# Patient Record
Sex: Male | Born: 1950 | Race: Black or African American | Hispanic: No | Marital: Single | Smoking: Never smoker
Health system: Southern US, Community
[De-identification: ages and names within clinical notes are randomized; demographics above are authoritative.]

## PROBLEM LIST (undated history)

## (undated) DIAGNOSIS — N429 Disorder of prostate, unspecified: Secondary | ICD-10-CM

## (undated) HISTORY — PX: COLONOSCOPY: SHX174

---

## 1978-09-24 DIAGNOSIS — T07XXXA Unspecified multiple injuries, initial encounter: Secondary | ICD-10-CM

## 1978-09-24 HISTORY — DX: Unspecified multiple injuries, initial encounter: T07.XXXA

## 2012-04-09 ENCOUNTER — Ambulatory Visit: Payer: No Typology Code available for payment source | Attending: Internal Medicine

## 2012-04-09 ENCOUNTER — Other Ambulatory Visit: Payer: Self-pay | Admitting: Internal Medicine

## 2012-04-09 DIAGNOSIS — R52 Pain, unspecified: Secondary | ICD-10-CM

## 2012-07-04 HISTORY — PX: CYSTOSCOPY: SUR368

## 2012-08-05 ENCOUNTER — Ambulatory Visit: Payer: No Typology Code available for payment source

## 2012-08-06 ENCOUNTER — Ambulatory Visit: Payer: No Typology Code available for payment source | Admitting: Certified Registered"

## 2012-08-06 ENCOUNTER — Ambulatory Visit: Payer: No Typology Code available for payment source | Admitting: Urology

## 2012-08-06 ENCOUNTER — Encounter: Payer: Self-pay | Admitting: Certified Registered"

## 2012-08-06 ENCOUNTER — Encounter
Admission: RE | Disposition: A | Discharge status: Home / Self Care | Payer: Self-pay | Source: Ambulatory Visit | Attending: Urology

## 2012-08-06 ENCOUNTER — Ambulatory Visit
Admission: RE | Admit: 2012-08-06 | Discharge: 2012-08-06 | Disposition: A | Discharge status: Home / Self Care | Payer: No Typology Code available for payment source | Source: Ambulatory Visit | Attending: Urology | Admitting: Urology

## 2012-08-06 DIAGNOSIS — Z87891 Personal history of nicotine dependence: Secondary | ICD-10-CM | POA: Insufficient documentation

## 2012-08-06 DIAGNOSIS — N2889 Other specified disorders of kidney and ureter: Secondary | ICD-10-CM | POA: Insufficient documentation

## 2012-08-06 DIAGNOSIS — D494 Neoplasm of unspecified behavior of bladder: Secondary | ICD-10-CM | POA: Diagnosis present

## 2012-08-06 HISTORY — PX: CYSTOSCOPY: SHX3552

## 2012-08-06 SURGERY — CYSTOSCOPY, DIAGNOSTIC
Anesthesia: Anesthesia General | Site: Pelvis | Wound class: Clean Contaminated

## 2012-08-06 MED ORDER — LEVOFLOXACIN 500 MG PO TABS
500.0000 mg | ORAL_TABLET | Freq: Every day | ORAL | Status: DC
Start: 2012-08-06 — End: 2014-07-29

## 2012-08-06 MED ORDER — MIDAZOLAM HCL 2 MG/2ML IJ SOLN
INTRAMUSCULAR | Status: DC | PRN
Start: 2012-08-06 — End: 2012-08-06
  Administered 2012-08-06: 2 mg via INTRAVENOUS

## 2012-08-06 MED ORDER — LEVOFLOXACIN IN D5W 500 MG/100ML IV SOLN
INTRAVENOUS | Status: AC
Start: 2012-08-06 — End: ?
  Filled 2012-08-06: qty 100

## 2012-08-06 MED ORDER — FENTANYL CITRATE 0.05 MG/ML IJ SOLN
INTRAMUSCULAR | Status: DC | PRN
Start: 2012-08-06 — End: 2012-08-06
  Administered 2012-08-06: 25 ug via INTRAVENOUS

## 2012-08-06 MED ORDER — LIDOCAINE(URO-JET) 2% JELLY (WRAP)
CUTANEOUS | Status: AC
Start: 2012-08-06 — End: ?
  Filled 2012-08-06: qty 1

## 2012-08-06 MED ORDER — PROPOFOL INFUSION 10 MG/ML
INTRAVENOUS | Status: DC | PRN
Start: 2012-08-06 — End: 2012-08-06
  Administered 2012-08-06: 200 mg via INTRAVENOUS

## 2012-08-06 MED ORDER — FENTANYL CITRATE 0.05 MG/ML IJ SOLN
INTRAMUSCULAR | Status: AC
Start: 2012-08-06 — End: ?
  Filled 2012-08-06: qty 2

## 2012-08-06 MED ORDER — MIDAZOLAM HCL 2 MG/2ML IJ SOLN
INTRAMUSCULAR | Status: AC
Start: 2012-08-06 — End: ?
  Filled 2012-08-06: qty 2

## 2012-08-06 MED ORDER — LIDOCAINE HCL 2 % IJ SOLN
INTRAMUSCULAR | Status: DC | PRN
Start: 2012-08-06 — End: 2012-08-06
  Administered 2012-08-06: 100 mg via INTRAVENOUS

## 2012-08-06 MED ORDER — LACTATED RINGERS IV SOLN
INTRAVENOUS | Status: DC | PRN
Start: 2012-08-06 — End: 2012-08-06

## 2012-08-06 MED ORDER — PHENAZOPYRIDINE HCL 100 MG PO TABS
100.0000 mg | ORAL_TABLET | Freq: Three times a day (TID) | ORAL | Status: DC | PRN
Start: 2012-08-06 — End: 2014-07-29

## 2012-08-06 MED ORDER — STERILE WATER FOR IRRIGATION IR SOLN
Status: DC | PRN
Start: 2012-08-06 — End: 2012-08-06
  Administered 2012-08-06: 3000 mL

## 2012-08-06 MED ORDER — LEVOFLOXACIN IN D5W 500 MG/100ML IV SOLN
500.0000 mg | INTRAVENOUS | Status: DC
Start: 2012-08-06 — End: 2012-08-06
  Administered 2012-08-06: 500 mg via INTRAVENOUS

## 2012-08-06 MED ORDER — ONDANSETRON HCL 4 MG/2ML IJ SOLN
INTRAMUSCULAR | Status: DC | PRN
Start: 2012-08-06 — End: 2012-08-06
  Administered 2012-08-06: 4 mg via INTRAVENOUS

## 2012-08-06 SURGICAL SUPPLY — 19 items
CATH TRANSVENE SQ 50CM (IV Supply) ×2 IMPLANT
CATHETER URETHRAL OD20 FR 30 CC FOLEY 3 (Catheter Miscellaneous) ×1 IMPLANT
CATHETER URETHRAL OD20 FR 30 CC FOLEY 3 WAY 2 STAGGER DRAINAGE EYE (Catheter Miscellaneous) ×1 IMPLANT
CATHETER URTH RBR DDRGL 30CC STD BDX (Catheter Micellaneous) ×2
CONTAINER SPECIMEN 4 OZ STRL (Procedure Accessories) ×2 IMPLANT
DRAIN INCS .25IN FULL FLUT RND SIL 19FR (Drain) ×1
DRAIN INCS SIL FULL FLUT RND 19FR .25IN (Drain) ×1
DRAIN OD19 FR RADIOPAQUE 4 FREE FLOW (Drain) ×1 IMPLANT
DRAIN OD19 FR RADIOPAQUE 4 FREE FLOW CHANNEL TROCAR CHANNEL DRAIN L1/4 (Drain) ×1 IMPLANT
GLOVE SYNT DURAPRENE SZ 7.5 (Glove) ×2 IMPLANT
INACTIVE USE LAWSON 93583 (Gown) ×2 IMPLANT
PACK SURG CYSTOSCOPY I (Pack) ×2 IMPLANT
PAD ELECTROSRG GRND REM W CRD (Procedure Accessories) ×2 IMPLANT
SET IRR DEHP 10 GTT/ML STRG 81IN LF STRL (Tubing) ×2
SET IRRIGATION L81 IN 10 GTT/ML STRAIGHT (Tubing) ×1 IMPLANT
SET IRRIGATION L81 IN 10 GTT/ML STRAIGHT NA DEHP BLADDER REGULATE (Tubing) ×1 IMPLANT
SYRINGE SLIP-TIP 10CC (Syringes, Needles) ×2 IMPLANT
SYRINGE TOOMEY (Syringes, Needles) ×2 IMPLANT
TUBE ARTHRO CASSETTE SET (Ortho Supply) ×2 IMPLANT

## 2012-08-06 NOTE — Anesthesia Postprocedure Evaluation (Signed)
Anesthesia Post Evaluation    Patient: Victor Avila    Procedures performed: Procedure(s) with comments:  CYSTOSCOPY, TUR BLADDER TUMOR - CYSTO, POSSIBLE TURBT  CYSTOSCOPY    Anesthesia type: General LMA    Patient location:Phase II PACU    Last vitals:   Filed Vitals:    08/06/12 0816   BP: 109/58   Pulse: 73   Temp:    Resp: 16   SpO2: 99%       Post pain: Patient not complaining of pain, continue current therapy      Mental Status:awake    Respiratory Function: tolerating room air    Cardiovascular: stable    Nausea/Vomiting: patient not complaining of nausea or vomiting    Hydration Status: adequate    Post assessment: no apparent anesthetic complications

## 2012-08-06 NOTE — Brief Op Note (Signed)
Preop bladder mass and atypical cytology and negative FISH  Postop Left ureterocele  Procedure Cysto  Surgeon P Zohaib Heeney  Complication Nil  Specimen Nil  EBL/Transufusion  Nil  Drains/implants none  Victor Avila

## 2012-08-06 NOTE — Op Note (Signed)
Procedure Date: 08/06/2012     Patient Type: A     SURGEON: Alisabeth Selkirk Humphrey Rolls MD  ASSISTANT:       PREOPERATIVE DIAGNOSES:  Urinary symptoms, atypical cytology, left trigonal bladder mass.     POSTOPERATIVE DIAGNOSIS:  Ureterocele.     TITLE OF PROCEDURE:  Cystoscopy.     BRIEF PREOPERATIVE HISTORY:    This is a 62 year old that was seen for urinary frequency and nocturia.  He  had not responded to alpha blockers and underwent cystoscopy.  At the time  of cystoscopy he was found to have a nonobstructive prostate, however,  there is a left trigonal mass at the same time as urine cytology was done  which came back as atypical.  He did have a normal FISH.  He is brought to  the OR today for cystoscopy/resection of bladder mass/possible biopsy.     DESCRIPTION OF PROCEDURE:  The patient received perioperative antibiotics, was placed under general  anesthesia, put in lithotomy position, prepped and draped in standard  sterile fashion.  The cystoscope was inserted into the bladder.  Once  again, the prostatic urethra was noted to be nonobstructive.  The bladder  neck is inspected and found to be open.  The right ureteral orifice is  normal in its location and position.  The rest of the bladder is normal.   On the left trigone mound is seen with a pinhole ureteral orifice, which  decompresses and the mound disappears, consistent with a nonobstructive  left ureterocele.  Since this is a benign condition, a biopsy is not done.   The bladder is drained and the patient was taken in stable condition to the  recovery room.           D:  08/06/2012 07:59 AM by Dr. Hewitt Shorts. Celine Mans, MD ([id number])  T:  08/06/2012 10:14 AM by [id number]      Everlean Cherry: [id number]) (Doc ID: [id number])

## 2012-08-06 NOTE — Transfer of Care (Signed)
Anesthesia Transfer of Care Note    Patient: Victor Avila    Procedures performed: Procedure(s) with comments:  CYSTOSCOPY, TUR BLADDER TUMOR - CYSTO, POSSIBLE TURBT  CYSTOSCOPY    Anesthesia type: General LMA    Patient location:Phase II PACU    Last vitals:   Filed Vitals:    08/06/12 0804   BP: 93/59   Pulse: 73   Temp: 98 F (36.7 C)   Resp: 16   SpO2: 98%       Post pain: Patient not complaining of pain, continue current therapy      Mental Status:awake and alert     Respiratory Function: tolerating room air    Cardiovascular: stable    Nausea/Vomiting: patient not complaining of nausea or vomiting    Hydration Status: adequate    Post assessment: no apparent anesthetic complications, no reportable events and no evidence of recall

## 2012-08-06 NOTE — Anesthesia Preprocedure Evaluation (Signed)
Anesthesia Evaluation    AIRWAY    Mallampati: I    TM distance: >3 FB  Neck ROM: full  Mouth Opening:full   CARDIOVASCULAR    cardiovascular exam normal       DENTAL    No notable dental hx    (+) upper dentures   PULMONARY    pulmonary exam normal     OTHER FINDINGS                      Anesthesia Plan    ASA 1     general                                 informed consent obtained    Plan discussed with CRNA.

## 2012-08-06 NOTE — Discharge Instructions (Signed)
Cystoscopy  Cystoscopy is a procedure that lets your doctor look directly inside your urethra and bladder. It can be used to:   Help diagnose a problem with your urethra, bladder, or kidneys.   Take a sample (biopsy) of bladder or urethral tissue.   Treat certain problems (such as removing kidney stones).   Place a stent to bypass an obstruction.   Take special x-rays of the kidneys.  Based on the findings, your doctor may recommend other tests or treatments.  What Is a Cystoscope?  A cystoscope is a telescope-like instrument that contains lenses and fiberoptics (small glass wires that make bright light). The cystoscope may be straight and rigid, or flexible to bend around curves in the urethra. The doctor may look directly into the cystoscope, or project the image onto a monitor.  Getting Ready  To prepare, stop taking any medications as instructed. Ask whether you should avoid eating or drinking anything after midnight before the procedure. Follow any other instructions your doctor gives you.  Tell yourdoctor before the exam if you:   Take any medications, such as aspirin or blood thinners   Have allergies to any medications   Are pregnant   The Procedure  Cystoscopy is done in the doctor's office or hospital. The doctor and sometimes a nurse are present during the procedure. It takes only a few minutes, longer if a biopsy, x-ray, or treatment needs to be done. During the procedure:   You lie on an exam table on your back, knees bent and legs apart. You are covered with a drape.   Your urethra and the area around it are washed. Anesthetic jelly may be applied to numb the urethra. Other pain medication is usually not needed. In some cases, you may be offered a mild sedative to help you relax. If a more extensive procedure is to be done, such as a biopsy or kidney stone removal, general anesthesia may be needed.   The cystoscope is inserted. A sterile fluid is put into the bladder to expand it. You may  feel pressure from this fluid.   When the procedure is done, the cystoscope is removed.  After the Procedure  If you had a sedative, general anesthesia, or spinal anesthesia, you must have someone drive you home. Once you're home:   Drink plenty of fluids.   You may have burning or light bleeding when you urinate--this is normal.   Medications may be prescribed to ease any discomfort or prevent infection. Take these as directed.   Call your doctor if you have heavy bleeding or blood clots, burning that lasts more than a day, a fever over101F , or trouble urinating.   2000-2014 [employer], [address], [place] [address]. All rights reserved. This information is not intended as a substitute for professional medical care. Always follow your healthcare professional's instructions.        Post Anesthesia Discharge Instructions    Although you may be awake and alert in the recovery room, small amounts of anesthetic remain in your system for about 24 hours.  You may feel tired and sleepy during this time.      You are advised to go directly home from the hospital.    Plan to stay at home and rest for the remainder of the day.    It is advisable to have someone with you at home for 24 hours after surgery.    Do not operate a motor vehicle, or any mechanical   or electrical equipment for the next 24 hours.      Be careful when you are walking around, you may become dizzy.  The effects of anesthesia and/or medications are still present and drowsiness may occur    Do not consume alcohol, tranquilizers, sleeping medications, or any other non prescribed medication for the remainder of the day.    Diet:  begin with liquids, progress your diet as tolerated or as directed by your surgeon.  Nausea and vomiting may occur in the next 24 hours.

## 2012-08-06 NOTE — H&P (Signed)
Pt seen and examined.  No significant changes to pulm, cardiac or abdominal exam.  Plan for cysto TURBT  Cariana Karge Humphrey Rolls

## 2012-08-07 ENCOUNTER — Encounter: Payer: Self-pay | Admitting: Urology

## 2014-07-15 ENCOUNTER — Ambulatory Visit
Admission: RE | Admit: 2014-07-15 | Discharge: 2014-07-15 | Disposition: A | Discharge status: Home / Self Care | Payer: No Typology Code available for payment source | Source: Ambulatory Visit | Attending: Internal Medicine | Admitting: Internal Medicine

## 2014-07-15 ENCOUNTER — Other Ambulatory Visit: Payer: Self-pay | Admitting: Internal Medicine

## 2014-07-15 DIAGNOSIS — Z01818 Encounter for other preprocedural examination: Secondary | ICD-10-CM | POA: Insufficient documentation

## 2014-07-29 ENCOUNTER — Telehealth: Payer: No Typology Code available for payment source

## 2014-08-04 ENCOUNTER — Ambulatory Visit: Payer: Self-pay

## 2014-08-04 ENCOUNTER — Ambulatory Visit: Payer: No Typology Code available for payment source | Admitting: Certified Registered"

## 2014-08-04 ENCOUNTER — Observation Stay: Payer: No Typology Code available for payment source | Admitting: Urology

## 2014-08-04 ENCOUNTER — Encounter
Admission: RE | Disposition: A | Discharge status: Home / Self Care | Payer: Self-pay | Source: Ambulatory Visit | Attending: Urology

## 2014-08-04 ENCOUNTER — Observation Stay
Admission: RE | Admit: 2014-08-04 | Discharge: 2014-08-05 | Disposition: A | Discharge status: Home / Self Care | Payer: No Typology Code available for payment source | Source: Ambulatory Visit | Attending: Urology | Admitting: Urology

## 2014-08-04 DIAGNOSIS — Z87891 Personal history of nicotine dependence: Secondary | ICD-10-CM | POA: Insufficient documentation

## 2014-08-04 DIAGNOSIS — N401 Enlarged prostate with lower urinary tract symptoms: Principal | ICD-10-CM

## 2014-08-04 DIAGNOSIS — N400 Enlarged prostate without lower urinary tract symptoms: Secondary | ICD-10-CM | POA: Diagnosis present

## 2014-08-04 DIAGNOSIS — R350 Frequency of micturition: Secondary | ICD-10-CM | POA: Insufficient documentation

## 2014-08-04 DIAGNOSIS — N403 Nodular prostate with lower urinary tract symptoms: Secondary | ICD-10-CM

## 2014-08-04 HISTORY — DX: Disorder of prostate, unspecified: N42.9

## 2014-08-04 HISTORY — PX: CYSTOSCOPY, TURP: SHX3621

## 2014-08-04 SURGERY — CYSTOSCOPY, WITH TRANSURETHRAL RESECTION PROSTATE (TURP)
Anesthesia: Anesthesia General | Site: Pelvis | Wound class: Clean Contaminated

## 2014-08-04 MED ORDER — LACTATED RINGERS IV SOLN
INTRAVENOUS | Status: DC | PRN
Start: 2014-08-04 — End: 2014-08-04

## 2014-08-04 MED ORDER — FENTANYL CITRATE (PF) 50 MCG/ML IJ SOLN (WRAP)
25.0000 ug | INTRAMUSCULAR | Status: DC | PRN
Start: 2014-08-04 — End: 2014-08-04

## 2014-08-04 MED ORDER — HYDROMORPHONE HCL 1 MG/ML IJ SOLN
0.5000 mg | INTRAMUSCULAR | Status: DC | PRN
Start: 2014-08-04 — End: 2014-08-04

## 2014-08-04 MED ORDER — ONDANSETRON HCL 4 MG/2ML IJ SOLN
INTRAMUSCULAR | Status: AC
Start: 2014-08-04 — End: ?
  Filled 2014-08-04: qty 2

## 2014-08-04 MED ORDER — LIDOCAINE HCL (PF) 2 % IJ SOLN
INTRAMUSCULAR | Status: AC
Start: 2014-08-04 — End: ?
  Filled 2014-08-04: qty 5

## 2014-08-04 MED ORDER — KETOROLAC TROMETHAMINE 15 MG/ML IJ SOLN
15.0000 mg | Freq: Four times a day (QID) | INTRAMUSCULAR | Status: DC
Start: 2014-08-04 — End: 2014-08-05
  Administered 2014-08-05: 15 mg via INTRAVENOUS
  Filled 2014-08-04: qty 1

## 2014-08-04 MED ORDER — ONDANSETRON HCL 4 MG/2ML IJ SOLN
4.0000 mg | Freq: Once | INTRAMUSCULAR | Status: DC | PRN
Start: 2014-08-04 — End: 2014-08-04

## 2014-08-04 MED ORDER — EPHEDRINE SULFATE 50 MG/ML IJ SOLN
INTRAMUSCULAR | Status: AC
Start: 2014-08-04 — End: ?
  Filled 2014-08-04: qty 1

## 2014-08-04 MED ORDER — OXYCODONE-ACETAMINOPHEN 5-325 MG PO TABS
2.0000 | ORAL_TABLET | ORAL | Status: DC | PRN
Start: 2014-08-04 — End: 2014-08-05

## 2014-08-04 MED ORDER — OXYCODONE-ACETAMINOPHEN 5-325 MG PO TABS
1.0000 | ORAL_TABLET | Freq: Once | ORAL | Status: DC | PRN
Start: 2014-08-04 — End: 2014-08-04

## 2014-08-04 MED ORDER — ACETAMINOPHEN 325 MG PO TABS
650.0000 mg | ORAL_TABLET | ORAL | Status: DC | PRN
Start: 2014-08-04 — End: 2014-08-05

## 2014-08-04 MED ORDER — LEVOFLOXACIN IN D5W 500 MG/100ML IV SOLN
INTRAVENOUS | Status: AC
Start: 2014-08-04 — End: ?
  Filled 2014-08-04: qty 100

## 2014-08-04 MED ORDER — LACTATED RINGERS IV SOLN
INTRAVENOUS | Status: DC
Start: 2014-08-04 — End: 2014-08-05

## 2014-08-04 MED ORDER — PROMETHAZINE HCL 25 MG/ML IJ SOLN
6.2500 mg | Freq: Once | INTRAMUSCULAR | Status: DC | PRN
Start: 2014-08-04 — End: 2014-08-04

## 2014-08-04 MED ORDER — EPHEDRINE SULFATE 50 MG/ML IJ SOLN
INTRAMUSCULAR | Status: DC | PRN
Start: 2014-08-04 — End: 2014-08-04
  Administered 2014-08-04: 10 mg via INTRAVENOUS

## 2014-08-04 MED ORDER — SODIUM CHLORIDE 0.9 % IJ SOLN
INTRAMUSCULAR | Status: AC
Start: 2014-08-04 — End: ?
  Filled 2014-08-04: qty 10

## 2014-08-04 MED ORDER — LIDOCAINE HCL 2 % IJ SOLN
INTRAMUSCULAR | Status: DC | PRN
Start: 2014-08-04 — End: 2014-08-04
  Administered 2014-08-04: 100 mg via INTRAVENOUS

## 2014-08-04 MED ORDER — ONDANSETRON HCL 4 MG/2ML IJ SOLN
INTRAMUSCULAR | Status: DC | PRN
Start: 2014-08-04 — End: 2014-08-04
  Administered 2014-08-04: 4 mg via INTRAVENOUS

## 2014-08-04 MED ORDER — DEXAMETHASONE SOD PHOSPHATE PF 10 MG/ML IJ SOLN
INTRAMUSCULAR | Status: AC
Start: 2014-08-04 — End: ?
  Filled 2014-08-04: qty 1

## 2014-08-04 MED ORDER — FENTANYL CITRATE (PF) 50 MCG/ML IJ SOLN (WRAP)
INTRAMUSCULAR | Status: DC | PRN
Start: 2014-08-04 — End: 2014-08-04
  Administered 2014-08-04 (×2): 50 ug via INTRAVENOUS

## 2014-08-04 MED ORDER — SUCCINYLCHOLINE CHLORIDE 20 MG/ML IJ SOLN
INTRAMUSCULAR | Status: AC
Start: 2014-08-04 — End: ?
  Filled 2014-08-04: qty 10

## 2014-08-04 MED ORDER — MIDAZOLAM HCL 2 MG/2ML IJ SOLN
INTRAMUSCULAR | Status: AC
Start: 2014-08-04 — End: ?
  Filled 2014-08-04: qty 2

## 2014-08-04 MED ORDER — HYDROMORPHONE HCL 1 MG/ML IJ SOLN
INTRAMUSCULAR | Status: DC | PRN
Start: 2014-08-04 — End: 2014-08-04
  Administered 2014-08-04: .25 mg via INTRAVENOUS
  Administered 2014-08-04: 0.5 mg via INTRAVENOUS
  Administered 2014-08-04: .25 mg via INTRAVENOUS

## 2014-08-04 MED ORDER — LEVOFLOXACIN IN D5W 500 MG/100ML IV SOLN
500.0000 mg | INTRAVENOUS | Status: AC
Start: 2014-08-04 — End: 2014-08-04
  Administered 2014-08-04: 500 mg via INTRAVENOUS

## 2014-08-04 MED ORDER — PROPOFOL INFUSION 10 MG/ML
INTRAVENOUS | Status: DC | PRN
Start: 2014-08-04 — End: 2014-08-04
  Administered 2014-08-04: 160 mg via INTRAVENOUS

## 2014-08-04 MED ORDER — FENTANYL CITRATE (PF) 50 MCG/ML IJ SOLN (WRAP)
INTRAMUSCULAR | Status: AC
Start: 2014-08-04 — End: ?
  Filled 2014-08-04: qty 2

## 2014-08-04 MED ORDER — DEXAMETHASONE SODIUM PHOSPHATE 4 MG/ML IJ SOLN (WRAP)
INTRAMUSCULAR | Status: DC | PRN
Start: 2014-08-04 — End: 2014-08-04
  Administered 2014-08-04: 8 mg via INTRAVENOUS

## 2014-08-04 MED ORDER — HYDROMORPHONE HCL 1 MG/ML IJ SOLN
INTRAMUSCULAR | Status: AC
Start: 2014-08-04 — End: ?
  Filled 2014-08-04: qty 1

## 2014-08-04 MED ORDER — ONDANSETRON HCL 4 MG/2ML IJ SOLN
4.0000 mg | Freq: Four times a day (QID) | INTRAMUSCULAR | Status: DC
Start: 2014-08-04 — End: 2014-08-05

## 2014-08-04 MED ORDER — MIDAZOLAM HCL 2 MG/2ML IJ SOLN
INTRAMUSCULAR | Status: DC | PRN
Start: 2014-08-04 — End: 2014-08-04
  Administered 2014-08-04: 2 mg via INTRAVENOUS

## 2014-08-04 MED ORDER — PROPOFOL 10 MG/ML IV EMUL (WRAP)
INTRAVENOUS | Status: AC
Start: 2014-08-04 — End: ?
  Filled 2014-08-04: qty 40

## 2014-08-04 SURGICAL SUPPLY — 12 items
BAG DRAINAGE URINARY (Procedure Accessories) ×2 IMPLANT
CATHETER URETHRAL OD22 FR 30 CC FOLEY 3 (Catheter Urine) ×1 IMPLANT
CATHETER URETHRAL OD22 FR FOLEY 3 WAY 2 STAGGER DRAINAGE EYE MEDIUM (Catheter Urine) ×1 IMPLANT
CATHETER URTH RBR DDRGL 30CC STD BDX (Catheter Urine) ×2
ELECTRODE CUTTING LOOP HIGH FREQUENCY ARTHROSCOPIC TURIS 24FR WA22523C (Endoscopic Supplies) ×1 IMPLANT
ELECTRODE RESECTION HF 30DEG (Endoscopic Supplies) ×2 IMPLANT
GLOVE SRG PLISPRN 8 BGL PI ULTRATOUCH G (Glove) ×2
GLOVE SURGICAL 8 BIOGEL PI ULTRATOUCH G (Glove) ×1 IMPLANT
GLOVE SURGICAL 8 BIOGEL PI ULTRATOUCH G POWDER FREE ROUGH BEAD CUFF (Glove) ×1 IMPLANT
HOLDER CATH (Procedure Accessories) ×2 IMPLANT
PACK SURG CYSTOSCOPY I (Pack) ×2 IMPLANT
SYRINGE TOOMEY (Syringes, Needles) ×2 IMPLANT

## 2014-08-04 NOTE — Anesthesia Preprocedure Evaluation (Addendum)
Anesthesia Evaluation    AIRWAY    Mallampati: III    TM distance: >3 FB  Neck ROM: full  Mouth Opening:full   CARDIOVASCULAR    cardiovascular exam normal       DENTAL    no notable dental hx    (+) upper dentures   PULMONARY    pulmonary exam normal     OTHER FINDINGS                      Anesthesia Plan    ASA 2     general               (The most common side effects from anesthesia are nausea, vomiting, sore throat.  Uncommon side effects include but not limited to  injury to eyes, lips, teeth, gums, vocal cords, allergic rections, nerve injuries. In addition there are side effects associated with your medical conditions.    Answered all questions to patient's satisfaction.  Pt understands and wishes to proceed.    Eric Robert Heinz, MD  07/25/2011 )      intravenous induction           Post op pain management: per surgeon    informed consent obtained    Plan discussed with CRNA.

## 2014-08-04 NOTE — H&P (Signed)
[facility] BRIEF H&P      Date of Note: 08/04/2014   Patient Name: Victor Avila     Date of Birth:  June 30, 1950     MRN:               [id number]     PCP:               Hortencia Conradi, MD     Admitting Diagnosis:  BPH  Planned Procedure:  TURP    HISTORY AND PHYSICAL     HISTORY OF PRESENT ILLNESS  Victor Avila is a 64 y.o. male with a history of BPH    PAST MEDICAL HISTORY  Past Medical History   Diagnosis Date   . Fractures 1980's     ribs   . Disorder of prostate        PAST SURGICAL HISTORY  Past Surgical History   Procedure Laterality Date   . Cystoscopy  07/04/2012   . Colonoscopy     . Cystoscopy  08/06/2012     Procedure: CYSTOSCOPY;  Surgeon: Neldon Newport, MD;  Location: [facility];  Service: Urology;  Laterality: N/A;       SOCIAL HISTORY  History     Social History   . Marital Status: Married     Spouse Name: N/A   . Number of Children: N/A   . Years of Education: N/A     Occupational History   . Not on file.     Social History Main Topics   . Smoking status: Former Smoker -- 2 years     Quit date: 08/05/1972   . Smokeless tobacco: Never Used   . Alcohol Use: Yes      Comment: rare   . Drug Use: No   . Sexual Activity: Not on file     Other Topics Concern   . Not on file     Social History Narrative       ALLERGIES  No Known Allergies    MEDICATIONS  No outpatient prescriptions have been marked as taking for the 08/04/14 encounter [facility] Encounter).       FAM HX  History reviewed. No pertinent family history.    REVIEW OF SYSTEMS     Ten point review of systems negative or as per HPI and below endorsements.    PHYSICAL EXAM     Vital Signs:   Filed Vitals:    07/29/14 1159 08/04/14 0650   BP:  126/72   Pulse:  70   Temp:  97.7 F (36.5 C)   TempSrc:  Oral   Resp:  18   Height: 1.778 m (5\' 10" )    Weight: 74.844 kg (165 lb)    SpO2:  99%       Constitutional: Patient speaks freely in full sentences.   Respiratory: Normal rate. No retractions or increased work of breathing.   Abdomen:  non-distended, soft, non-tender, no rebound or guarding  Genitourinary: Nl phallus, testes descended bilaterally, normal epididymis bilaterally, no inguinal hernia noted bilaterally    Skin: no rashes, jaundice or other lesions  Neurologic: Normal  Psychiatric: AAOx3, affect and mood appropriate. The patient is alert, interactive, appropriate.      Patient is clinically ready for procedure:  Yes    Victor Avila  08/04/2014, 7:33 AM

## 2014-08-04 NOTE — PACU (Signed)
Report to [facility], [address]. No questions/comments/concerns stated. No other needs at this time. Vital signs stable. Denies pain. No signs of distress or discomfort.

## 2014-08-04 NOTE — Plan of Care (Signed)
Problem: Pain  Goal: Patient's pain/discomfort is manageable  Outcome: Progressing  Educated pt on numeric pain scale. Pt stated pain level of "0/10" with a comfort function goal of "5/10". Pt not requesting prn pain medication at this time. Pt refusing pain medication. Pt refusing toradol. Educated pt on PRN medication for pain and updated white board displaying next time medication is available, pt verbalized understanding. Offered non-pharmocological interventions for pt comfort; closed room door to reduce noise, provided warm blanket, turned off lights to pt's preference, obtained pt PRN drink, help pt to reposition, provided pt therapeutic conversation distraction.     Problem: Moderate/High Fall Risk Score >5  Goal: Patient will remain free of falls  Outcome: Progressing  Fall risk assessed and placed on white board. Educated pt on Architectural technologist. Pt wearing non slip socks. Pt not yet OOB at this time. Per order, pt not to get OOB until POD 1.  Educated pt on call bell system/white board and its use, pt verbalized understanding. During hourly rounding, bed side table, water, personal items, and call bell placed in reach of pt.     Comments:   Status: Pt AOX4. Pt foley catheter in place, CDI running. Urine light pink in color. Minimal small clots noted, clots resolving on own. Pt denies n/v. - flatus. Pt not yet OOB at this time per order.    Plan:  Reassess pain/n/v and medicate prn. Continue diet. Encourage increased po intake. Assist c/ toileting and ambulation prn. Activity and oob to chair as tolerated. Therapeutic communication prn. Monitor for ssx of infection.

## 2014-08-04 NOTE — Op Note (Signed)
[facility]     Date of Operation: 08/04/2014   Patient Name: Victor Avila     Date of Birth:  1950/09/30     MRN:               [id number]      PREOPERATIVE DIAGNOSIS:   BPH    POSTOPERATIVE DIAGNOSIS:   Same    SURGEON:   Patrena Santalucia S Elysia Grand     ASSISTANT:   Circulator: Lowella Petties, RN  Scrub Person: Erroll Luna    ANESTHESIA:  General    OPERATION:   1. Cystoscopy  2. Tranurethral resection of prostate    FINDINGS:   1. BPH    COMPLICATIONS:   None    DRAINS:   None    BLOOD LOSS:   * No blood loss amount entered *     BLOOD PRODUCTS ADMINISTERED:   None    Specimen: prostate chips    HISTORY OF PRESENT ILLNESS:   Victor Avila is a 64 y.o. male who has BPH and is scheduled for TURP.    PROCEDURE IN DETAIL:  The patient was identified in the preoperative area and the risks and benefits were discussed. The patient was then brought back to the operative theater and placed supine on the operating room table. Anesthesia was administered along with pre-operative antibiotics and the patient was positioned in the lithotomy position, ensuring that all pressure points were padded and that no hyperextension/hyperflexion occurred. The patient was prepped and draped in sterile fashion. A surgical time out was conducted in the presence of nursing, the surgeon and anesthesia staff, and all agreed to proceed with the procedure as stated.    Physical examination revealed no gross abnormalities. A 54F cystoscope was inserted in the urethral meatus and the urethra was noted to be normal. The prostate was enlarged with bilobar BPH. The bladder was entered and noted to be normal with no masses. Bilateral ureteral orifices were in orthotopic position. A resectoscope was used to resect the obstructive tissue with a bipolar loop.  This was begun at the bladder neck and carried to veru.  The depth was to the surgical capsule.  The chips were all irrigated.  Hemostasis was confirmed.   A 3 way foley catheter was  then placed on CBI. At this time, the pt was awakened and taken to the recovery room in stable condition.  DISPOSITION/PLAN:   Observation overnight with CBI    Owen Pratte S Corena Tilson  08/04/2014, 8:27 AM

## 2014-08-04 NOTE — Anesthesia Postprocedure Evaluation (Signed)
Anesthesia Post Evaluation    Patient: Victor Avila    Procedures performed: Procedure(s):  CYSTOSCOPY, TURP    Anesthesia type: General LMA    Patient location:PACU    Last vitals:   Filed Vitals:    08/04/14 0900   BP: 111/68   Pulse: 66   Temp: 36.4 C (97.5 F)   Resp: 14   SpO2: 100%       Post pain: Patient not complaining of pain, continue current therapy      Mental Status:awake    Respiratory Function: tolerating room air    Cardiovascular: stable    Nausea/Vomiting: patient not complaining of nausea or vomiting    Hydration Status: adequate    Post assessment: no apparent anesthetic complications

## 2014-08-04 NOTE — Progress Notes (Signed)
Received report from PACU RN at bedside/via phone prior to pt arrival. Pt admitted to floor from PACU in stable condition.  AOx4, VSS, POSS 2, pt reports pain 0/10, denies N/V. Oriented to unit and call bell/telephone, pt/family verbalized understanding. Belongings at bedside. Urine light pink CBI running at moderate rate.

## 2014-08-04 NOTE — Transfer of Care (Signed)
Anesthesia Transfer of Care Note    Patient: Victor Avila    Procedures performed: Procedure(s):  CYSTOSCOPY, TURP    Anesthesia type: General LMA    Patient location:Phase I PACU    Last vitals:   Filed Vitals:    08/04/14 0844   BP: 117/71   Pulse: 68   Temp: 36.3 C (97.3 F)   Resp: 14   SpO2: 98%       Post pain: Patient not complaining of pain, continue current therapy      Mental Status:awake and alert     Respiratory Function: tolerating room air    Cardiovascular: stable    Nausea/Vomiting: patient not complaining of nausea or vomiting    Hydration Status: adequate    Post assessment: no apparent anesthetic complications, no reportable events and no evidence of recall

## 2014-08-05 ENCOUNTER — Encounter: Payer: Self-pay | Admitting: Urology

## 2014-08-05 DIAGNOSIS — N401 Enlarged prostate with lower urinary tract symptoms: Secondary | ICD-10-CM

## 2014-08-05 LAB — CBC AND DIFFERENTIAL
Basophils Absolute Automated: 0 10*3/uL (ref 0.00–0.20)
Basophils Automated: 0 %
Eosinophils Absolute Automated: 0.01 10*3/uL (ref 0.00–0.70)
Eosinophils Automated: 0 %
Hematocrit: 33.4 % — ABNORMAL LOW (ref 42.0–52.0)
Hgb: 11.3 g/dL — ABNORMAL LOW (ref 13.0–17.0)
Immature Granulocytes Absolute: 0.03 10*3/uL
Immature Granulocytes: 0 %
Lymphocytes Absolute Automated: 1.9 10*3/uL (ref 0.50–4.40)
Lymphocytes Automated: 19 %
MCH: 28.3 pg (ref 28.0–32.0)
MCHC: 33.8 g/dL (ref 32.0–36.0)
MCV: 83.5 fL (ref 80.0–100.0)
MPV: 10.6 fL (ref 9.4–12.3)
Monocytes Absolute Automated: 0.85 10*3/uL (ref 0.00–1.20)
Monocytes: 9 %
Neutrophils Absolute: 7.1 10*3/uL (ref 1.80–8.10)
Neutrophils: 72 %
Nucleated RBC: 0 /100 WBC (ref 0–1)
Platelets: 211 10*3/uL (ref 140–400)
RBC: 4 10*6/uL — ABNORMAL LOW (ref 4.70–6.00)
RDW: 14 % (ref 12–15)
WBC: 9.86 10*3/uL (ref 3.50–10.80)

## 2014-08-05 LAB — BASIC METABOLIC PANEL
Anion Gap: 9 (ref 5.0–15.0)
BUN: 13 mg/dL (ref 9.0–28.0)
CO2: 23 mEq/L (ref 22–29)
Calcium: 8.7 mg/dL (ref 8.5–10.5)
Chloride: 108 mEq/L (ref 100–111)
Creatinine: 0.9 mg/dL (ref 0.7–1.3)
Glucose: 85 mg/dL (ref 70–100)
Potassium: 3.7 mEq/L (ref 3.5–5.1)
Sodium: 140 mEq/L (ref 136–145)

## 2014-08-05 LAB — GFR: EGFR: >60

## 2014-08-05 LAB — HEMOLYSIS INDEX: Hemolysis Index: 5 (ref 0–18)

## 2014-08-05 MED ORDER — OXYCODONE-ACETAMINOPHEN 5-325 MG PO TABS
1.0000 | ORAL_TABLET | ORAL | Status: DC | PRN
Start: 2014-08-05 — End: 2017-05-10

## 2014-08-05 MED ORDER — NITROFURANTOIN MONOHYD MACRO 100 MG PO CAPS
100.0000 mg | ORAL_CAPSULE | Freq: Two times a day (BID) | ORAL | Status: AC
Start: 2014-08-05 — End: ?

## 2014-08-05 NOTE — Plan of Care (Signed)
Problem: Pain  Goal: Patient's pain/discomfort is manageable  Outcome: Progressing  Pt does not c/o any pain or discomfort. SCDs to BLE.    Problem: Moderate/High Fall Risk Score >5  Goal: Patient will remain free of falls  Outcome: Progressing  Assessed pt's fall risk and placed fall risk on white board. Educated pt on use of call bell/telephone and white board, pt verbalized understanding. Maintained safe environment during hourly rounding; placed bedside table, water, personal items, and call bell close to pt. Pt not yet OOB per order, to ambulate in am.    Problem: Bladder/Voiding  Goal: Remains continent  Outcome: Progressing  Pt has foley catheter to leg strap in place, CBI running, yellow clear output, no clots noted.

## 2014-08-05 NOTE — Discharge Summary (Signed)
DISCHARGE NOTE    Date Time: 08/05/2014 7:57 AM  Patient Name: [facility]  Attending Physician: Neldon Newport, MD    Date of Admission:   08/04/2014    Date of Discharge:   08/05/14    Reason for Admission:   Benign localized hyperplasia of prostate with urinary obstruction and other lower urinary tract symptoms (LUTS)(600.21) [N40.1]    Problems:     Hospital Problems:  Active Problems:    BPH (benign prostatic hyperplasia)    Benign localized hyperplasia of prostate with urinary obstruction and other lower urinary tract symptoms (LUTS)(600.21)      Problem Lists:  Patient Active Problem List   Diagnosis   . Bladder tumor   . BPH (benign prostatic hyperplasia)   . Benign localized hyperplasia of prostate with urinary obstruction and other lower urinary tract symptoms (LUTS)(600.21)           Discharge Dx:   BPH    Consultations:   nil    Procedures performed:   TURP    Hospital Course:   Uncomplicated     Discharge Medications:     Current Discharge Medication List      START taking these medications    Details   nitrofurantoin, macrocrystal-monohydrate, (MACROBID) 100 MG capsule Take 1 capsule (100 mg total) by mouth 2 (two) times daily.  Qty: 14 capsule, Refills: 0      oxyCODONE-acetaminophen (PERCOCET) 5-325 MG per tablet Take 1 tablet by mouth every 4 (four) hours as needed for Pain.  Qty: 20 tablet, Refills: 0               Discharge Instructions:   Follow up in 2 weeks.  No heavy lifting until then    Follow-up with urologist in 2 weeks      Signed by: Neldon Newport

## 2014-08-05 NOTE — Discharge Instructions (Signed)
Transurethral Resection of the Prostate (TURP): Home Recovery  Take it easy for the first month or so while you heal. During the first few weeks, you may feel burning when you pass urine. You may also feel like you have to urinate often. These sensations will go away. If your urine becomes bright red, it means that the treated area is bleeding. This may happen on and off for a month or so after a TURP. If this occurs, rest and drink plenty of fluids until the bleeding stops.    While You Are Healing  To help prevent problems during the first month after your surgery, follow these tips:   Drink plenty of fluids.   Avoid strenuous exercise.   Don't lift anything over 10 pounds.   Avoid sexual activity and strenuous exercise.   Avoid straining at stool. If you are constipated, take stool softeners or laxatives for a few days.   Talk to your doctor about when you canreturn to work.   Ask your doctor when you can begindriving again.   Don't sit for more than 60 minutes withoutgetting up.   Check with your doctor before taking over-the-counter pain relievers. These include aspirin, ibuprofen, and naproxen.  Follow-up Visits  You will visit your doctor to make sure you are healing without problems.If tests were done on your prostate tissue your doctor will discuss the results with you.     Difficulty Controlling Your Urine  Some men will have difficulty controlling their urine (urinaryincontinence) after this surgery. This may last for a few days, weeks, or months, but it will improve with time. You may also passyour urine more often (urinary frequency), like you did before the surgery. This will also improve as you begin to heal.   2000-2015 [employer] [address], [place], [place] [address]. All rights reserved. This information is not intended as a substitute for professional medical care. Always follow your healthcare professional's instructions.      Date Time: 08/05/2014 10:37  AM  Attending Physician: Neldon Newport, MD    Date of Admission:   08/04/2014    Reason for Admission:   Benign localized hyperplasia of prostate with urinary obstruction and other lower urinary tract symptoms (LUTS)(600.21) [N40.1]    Follow up:        Neurology (If Stroke): ______________________  Phone:______________________    Other:___________________________________   Phone: ______________________       Medications:  . Your medications have been listed for you on the Medication Reconciliation Discharge Home List. Please bring a copy of all discharge instructions, including your Medication Reconciliation Discharge Home List when you visit your physician.    . Continue taking all medications even if you feel well, unless otherwise instructed by physician.  . Do not take any over-the-counter medications or herbal supplements without checking with your pharmacist or doctor.         Activity: as tolerated  . Rise slowly from a sitting or lying position. Increase activity slowly, unless otherwise instructed by physician.  Marland Kitchen Perform exercises as desginated by Therapist and Physician.  . In the event of severe shortness of breath or chest discomfort, call 911. Do NOT drive to the hospital.  . Speak with your physician regarding specific driving and/or work restrictions.    Diet: regular  . If you have special diet orders, you have been given printed diet instructions.   ________________________________________________________________________    Tobacco Cessation Counseling:  If you are currently a tobacco  user or have used tobacco within the last 12 months, we have provided you with written Tobacco Cessation Counseling.  ________________________________________________________________________    Heart Failure Education:  If you have a diagnosis of a Heart Failure, we have provided you with written Heart Failure Education.   . Weigh yourself once a day at the same time. Record and bring the weight record to your next  physician appointment.   . Call your doctor if you gain more than 3 pounds in one day or 5 pounds in one week, or if you experience shortness of breath, leg swelling and/or chest discomfort.   . Enroll in the [employer] Tel-Assurance Program, a heart failure patient support program. Call [phone] for additional details.   ________________________________________________________________________    Diamond Nickel Education:  . Call 911 for:  Marland Kitchen Sudden numbness or weakness of the face  . Sudden numbness of the arm or leg especially one side of the body  . Sudden confusion, trouble speaking or understanding  . Sudden trouble seeing in one or both eyes  . Sudden trouble walking or dizziness, loss of balance or coordination      . For promotion of your health, we have provided you with personalized written education on risk factors specific to your diagnosis, including but not limited to:  Marland Kitchen High Blood Pressure  . High Cholesterol  . Atrial Fibrillation  . Overweight  . Diabetes  . Smoking         Vaccinations  Pneumonia Vaccine Received on:  Flu Vaccine Received on:                  Signed by: Doroteo Glassman, RN    I HAVE RECEIVED AND UNDERSTAND THESE DISCHARGE INSTRUCTIONS.

## 2014-08-05 NOTE — Progress Notes (Signed)
A&Ox3; vitals stable; denies pain; voided 200 cc of bloody urine no clots after the Foley was removed; discharge instructions included bleeding, retention, infection, follow-up appointment, medications schedule and side effects, activity restriction, regular diet; safety maintained;

## 2014-08-06 LAB — LAB USE ONLY - HISTORICAL SURGICAL PATHOLOGY

## 2017-05-18 ENCOUNTER — Emergency Department: Payer: No Typology Code available for payment source

## 2017-05-18 ENCOUNTER — Emergency Department
Admission: EM | Admit: 2017-05-18 | Discharge: 2017-05-18 | Disposition: A | Discharge status: Home / Self Care | Payer: No Typology Code available for payment source | Attending: Emergency Medicine | Admitting: Emergency Medicine

## 2017-05-18 DIAGNOSIS — J189 Pneumonia, unspecified organism: Secondary | ICD-10-CM

## 2017-05-18 DIAGNOSIS — J181 Lobar pneumonia, unspecified organism: Secondary | ICD-10-CM | POA: Insufficient documentation

## 2017-05-18 MED ORDER — BENZONATATE 100 MG PO CAPS
100.0000 mg | ORAL_CAPSULE | Freq: Three times a day (TID) | ORAL | 0 refills | Status: AC | PRN
Start: 2017-05-18 — End: ?

## 2017-05-18 MED ORDER — LEVOFLOXACIN 750 MG PO TABS
750.0000 mg | ORAL_TABLET | Freq: Once | ORAL | Status: AC
Start: 2017-05-18 — End: 2017-05-18
  Administered 2017-05-18: 750 mg via ORAL
  Filled 2017-05-18: qty 1

## 2017-05-18 MED ORDER — ALBUTEROL SULFATE HFA 108 (90 BASE) MCG/ACT IN AERS
2.0000 | INHALATION_SPRAY | RESPIRATORY_TRACT | 0 refills | Status: AC | PRN
Start: 2017-05-18 — End: 2017-06-17

## 2017-05-18 MED ORDER — LEVOFLOXACIN 750 MG PO TABS
750.0000 mg | ORAL_TABLET | Freq: Every day | ORAL | 0 refills | Status: AC
Start: 2017-05-18 — End: 2017-05-23

## 2017-05-18 MED ORDER — BENZONATATE 100 MG PO CAPS
100.0000 mg | ORAL_CAPSULE | Freq: Once | ORAL | Status: AC
Start: 2017-05-18 — End: 2017-05-18
  Administered 2017-05-18: 100 mg via ORAL
  Filled 2017-05-18: qty 1

## 2017-05-18 MED ORDER — ALBUTEROL SULFATE HFA 108 (90 BASE) MCG/ACT IN AERS
2.0000 | INHALATION_SPRAY | Freq: Once | RESPIRATORY_TRACT | Status: AC
Start: 2017-05-18 — End: 2017-05-18
  Administered 2017-05-18: 2 via RESPIRATORY_TRACT
  Filled 2017-05-18: qty 1

## 2017-05-18 NOTE — ED Notes (Signed)
EMERGENCY DEPARTMENT PHYSICIAN ATTESTATION OF APP NOTE     Date: 05/18/2017  Patient Name: Victor Avila  Physician Contact: 2:41 PM       HPI:  67 y.o. male presenting to the ED for cough x 3 weeks. Pt was dx with influenza on Monday and got started on Augmentin, still w/ Sx.     On exam: Lungs CTAB. Speaking full sentences, no stridor, no drooling, no accessory muscle use. 2+ radial pulse.    Impression & Plan: CXR from outpt today LLL PNA. Will change to Levaquin. Supportive care. F/U PCP    _______________________________      Attestations:  This note is prepared by Marcille Blanco acting as Scribe for Carles Collet, MD.    Carles Collet, MD.  The scribe's documentation has been prepared under my direction and personally reviewed by me in its entirety.  I confirm that the note above accurately reflects all work, treatment, procedures, and medical decision making performed by me.                                             _______________________________    I, Dr. Conley Rolls, Everette Rank, MD, personally saw and examined the patient with the advanced practice provider.       Annett Fabian, MD  05/18/17 2028

## 2017-05-18 NOTE — ED Provider Notes (Signed)
EMERGENCY DEPARTMENT HISTORY AND PHYSICAL EXAM     Physician/Midlevel provider first contact with patient: 05/18/17 1359         Date: 05/18/2017  Patient Name: Victor Avila  Attending Physician: Annett Fabian, MD  Advanced Practice Provider: Zandra Abts    History of Presenting Illness       History Provided By: Patient and Cousin    Chief Complaint:  Chief Complaint   Patient presents with   . Cough   . Influenza     Onset: 3 weeks  Timing: Intermittent  Location: none  Quality: productive cough (clear phlegm)  Severity: n/a  Exacerbating factors: lying supine  Alleviating factors: none  Associated Symptoms: fever, chills, fatigue  Pertinent Negatives: Denies wheezing, CP, SOB, n/v, abd pain, tingling, numbness.    Additional History: Victor Avila is a 67 y.o. male with h/o prostate disorder presenting to the ED with productive cough x 3 weeks in the setting of known positive influenza testing 4 days ago. Coughing is described as productive of clear phlegm, worsened by lying supine and at night time. Cousin states that his coughing episodes can be severe at night time. He states he has been coughing intermittently for 3 weeks but did not feel flu like symptoms until about 4-5 days ago when he was diagnosed with the flu. Started on Augmentin on Monday and takes Tylenol and Robitussin PRN fever, last taken upon waking up this morning. States his fever, chills, and myalgias have improved today but still feels fatigued. Labs done by PCP, no abnormal findings. Works in [employer] and feels his job may sometimes aggravate his cough.     PCP: Rondel Baton, MD  SPECIALISTS:    Current Facility-Administered Medications   Medication Dose Route Frequency Provider Last Rate Last Dose   . albuterol (PROVENTIL HFA;VENTOLIN HFA) inhaler 2 puff  2 puff Inhalation Once Zandra Abts, PA       . levoFLOXacin (LEVAQUIN) tablet 750 mg  750 mg Oral Once Zandra Abts, PA         Current Outpatient Prescriptions    Medication Sig Dispense Refill   . albuterol (PROVENTIL HFA;VENTOLIN HFA) 108 (90 Base) MCG/ACT inhaler Inhale 2 puffs into the lungs every 4 (four) hours as needed for Wheezing or Shortness of Breath (coughing) Dispense with spacer 1 Inhaler 0   . benzonatate (TESSALON) 100 MG capsule Take 1 capsule (100 mg total) by mouth 3 (three) times daily as needed for Cough 15 capsule 0   . levoFLOXacin (LEVAQUIN) 750 MG tablet Take 1 tablet (750 mg total) by mouth daily for 5 days 5 tablet 0   . nitrofurantoin, macrocrystal-monohydrate, (MACROBID) 100 MG capsule Take 1 capsule (100 mg total) by mouth 2 (two) times daily. 14 capsule 0       Past History     Past Medical History:  Past Medical History:   Diagnosis Date   . Disorder of prostate    . Fractures 1980's    ribs       Past Surgical History:  Past Surgical History:   Procedure Laterality Date   . COLONOSCOPY     . CYSTOSCOPY  07/04/2012   . CYSTOSCOPY  08/06/2012    Procedure: CYSTOSCOPY;  Surgeon: Neldon Newport, MD;  Location: [facility];  Service: Urology;  Laterality: N/A;   . CYSTOSCOPY, TURP N/A 08/04/2014    Procedure: CYSTOSCOPY, TURP;  Surgeon: Neldon Newport, MD;  Location: [facility];  Service: Urology;  Laterality: N/A;       Family History:  History reviewed. No pertinent family history.    Social History:  Social History   Substance Use Topics   . Smoking status: Former Smoker     Years: 2.00     Quit date: 08/05/1972   . Smokeless tobacco: Never Used   . Alcohol use Yes      Comment: rare       Allergies:  No Known Allergies    Review of Systems     Review of Systems   Constitutional: Positive for chills and fever. Negative for diaphoresis and fatigue.   HENT: Negative for congestion, rhinorrhea, sinus pain, sore throat and trouble swallowing.    Eyes: Negative for photophobia, pain and redness.   Respiratory: Positive for cough. Negative for chest tightness, shortness of breath and wheezing.    Cardiovascular: Negative for chest pain,  palpitations and leg swelling.   Gastrointestinal: Negative for abdominal pain, diarrhea, nausea and vomiting.   Genitourinary: Negative for dysuria, frequency, hematuria and urgency.   Musculoskeletal: Positive for myalgias. Negative for back pain, neck pain and neck stiffness.   Skin: Negative for color change, pallor, rash and wound.   Neurological: Negative for dizziness, tremors, weakness, light-headedness, numbness and headaches.       Physical Exam     Vitals:    05/18/17 1342   BP: 145/76   Pulse: 96   Resp: 18   Temp: 99.8 F (37.7 C)   TempSrc: Oral   SpO2: 96%   Weight: 74.8 kg   Height: 5\' 10"  (1.778 m)       Gen: A&Ox3, WD/WN, lying on gurney in no acute distress. Coughing appreciated in ED.  Head: Normocephalic, atraumatic. Facial movements symmetric  Skin: anicteric, no cyanosis, no pruritus, warm to touch, normal turgor  Eyes: PERRLA, sclera anicteric, no conjunctival injection  Ears: TMs pearly gray bilat  Oropharynx: clear, mucous membranes moist, uvula midline, no lesions. No oropharyngeal erythema. No tonsillar hypertrophy, exudates, or petechiae. No peritonsillar abscess. Tolerating secretions.  Neck: Supple, no lymphadenopathy, FAROM, midline NT  Lungs: CTAB, no w/r/r, symmetric chest expansion  CV: S1/S2 crisp, RRR, no m/r/g  Abd: ND, NABSx4, soft, NT. no guarding, rigidity, or rebound tenderness. no masses or hepatosplenomegaly  Back: no gross deformity, mid-line NT  Ext: no edema, peripheral pulses 2+, capillary refill < 2 sec, no calf tenderness    Diagnostic Study Results     Labs -     Results     ** No results found for the last 24 hours. **          Radiologic Studies -   Radiology Results (24 Hour)     Procedure Component Value Units Date/Time    Chest 2 Views [[id number]] Collected:  05/18/17 1429    Order Status:  Completed Updated:  05/18/17 1435    Narrative:       XR CHEST 2 VIEWS    CLINICAL INDICATION:   cough x 3 weeks, +fever, chils, sweats    COMPARISON:  07/13/2014    FINDINGS: The cardiomediastinal silhouette appears within normal size  limits. There is retrocardiac left lower lobe airspace consolidation.  There is mild associated blunting of the left costophrenic angle and  small left pleural effusion cannot be excluded. The right lung appears  clear. There is no evidence for pneumothorax. The pulmonary vascularity  appears unremarkable.      Impression:  Left lower lobe airspace consolidation. Clinical correlation  and continued radiographic follow-up to resolution is recommended.    Sandie Ano, MD   05/18/2017 2:31 PM      .    Medical Decision Making   I am the first provider for this patient.    I reviewed the vital signs, available nursing notes, past medical history, past surgical history, family history and social history.    Vital Signs-Reviewed the patient's vital signs.     Patient Vitals for the past 12 hrs:   BP Temp Pulse Resp   05/18/17 1342 145/76 99.8 F (37.7 C) 96 18     Pulse Oximetry Analysis - Normal 96% on RA    Old Medical Records: Old medical records.  Nursing notes.     ED Course:   2:52 PM - D/w NP Melissa, who agrees with plan to switch from Augmentin to Levaquin.     Provider Notes:   Dorean Daniello is a 67 y.o. male presenting to the ED with persistent cough in the setting of influenza diagnosis on Monday per PCP. Afebrile in ED, VSS, lungs clear, well appearing. CXR shows left lower lobe pneumonia. Given no improvement with Augmentin, will change to Levaquin. I have discussed the risks and benefits of fluoroquinolones with this patient.  Specifically, I have warned the patient about the slim possibility of tendinopathy and to immediately stop the medication for any muscle or tendon pain after starting the medication and to seek immediate medical attention. Supportive tx discussed. Incentive spirometer provided. F/u PCP. Patient counseled on diagnosis, treatment plan, f/u plans, and signs and symptoms when to return to ED. Pt  is stable and ready for discharge.    D/w Dr. Conley Rolls, who also evaluated patient    Diagnosis     Clinical Impression:   1. Pneumonia of left lower lobe due to infectious organism        Treatment Plan:   ED Disposition     ED Disposition Condition Date/Time Comment    Discharge  Fri May 18, 2017  2:59 PM Victor Avila discharge to home/self care.    Condition at disposition: Stable            _______________________________    CHART OWNERSHIPLevora Angel, PA-C, am the primary clinician of record.  _______________________________       Zandra Abts, PA  05/18/17 1504

## 2017-05-18 NOTE — Discharge Instructions (Signed)
Pneumonia     You have been diagnosed with pneumonia.     Pneumonia is an infection of the small air tubes and sacs of the lungs. It can be caused by a virus, bacteria or fungus.      Symptoms include fever (temperature higher than 100.4ºF / 38ºC), shortness of breath and a cough that produces a green or yellow material. You may have chest pain, vomiting, or headache.     Some people with pneumonia must stay in the hospital. Patients with certain risk factors, like diabetes, cancer, alcoholism, or another chronic medical condition, can be difficult to treat. The physician has determined that it is OK for you to go home.     Follow up closely with your doctor.     Do not smoke. Smoking may cause your pneumonia symptoms to become worse. Smoking also causes heart disease, cancer, and birth defects. If you smoke, ask your doctor for ideas about how to quit.  · If you do not smoke, avoid others who do. Smoke will irritate your lungs and make your breathing worse while you have pneumonia.     YOU SHOULD SEEK MEDICAL ATTENTION IMMEDIATELY, EITHER HERE OR AT THE NEAREST EMERGENCY DEPARTMENT, IF ANY OF THE FOLLOWING OCCURS:  · You wheeze or have trouble breathing.  · You have a fever (temperature higher than 100.4ºF / 38ºC) that won’t go away.  · You cough up blood.  · You have chest pain.  · You vomit and cannot keep medications down or you feel dizzy, weak or confused.  · You feel worse or don’t improve in 2 to 3 days.  · You are unable to perform your normal activities.

## 2018-08-15 ENCOUNTER — Encounter (INDEPENDENT_AMBULATORY_CARE_PROVIDER_SITE_OTHER): Payer: Self-pay

## 2018-11-22 ENCOUNTER — Other Ambulatory Visit: Payer: Self-pay

## 2018-11-22 ENCOUNTER — Emergency Department (HOSPITAL_COMMUNITY): Payer: Medicare Other

## 2018-11-22 ENCOUNTER — Observation Stay (HOSPITAL_COMMUNITY)
Admission: EM | Admit: 2018-11-22 | Discharge: 2018-11-23 | Disposition: A | Discharge status: Home / Self Care | Payer: Medicare Other | Attending: Orthopedic Surgery | Admitting: Orthopedic Surgery

## 2018-11-22 ENCOUNTER — Encounter (HOSPITAL_COMMUNITY)
Admission: EM | Disposition: A | Discharge status: Home / Self Care | Payer: Self-pay | Source: Home / Self Care | Attending: Emergency Medicine

## 2018-11-22 ENCOUNTER — Emergency Department (HOSPITAL_COMMUNITY): Payer: Medicare Other | Admitting: Certified Registered Nurse Anesthetist

## 2018-11-22 ENCOUNTER — Encounter (HOSPITAL_COMMUNITY): Payer: Self-pay | Admitting: Emergency Medicine

## 2018-11-22 DIAGNOSIS — Z20828 Contact with and (suspected) exposure to other viral communicable diseases: Secondary | ICD-10-CM | POA: Diagnosis not present

## 2018-11-22 DIAGNOSIS — S82224A Nondisplaced transverse fracture of shaft of right tibia, initial encounter for closed fracture: Secondary | ICD-10-CM

## 2018-11-22 DIAGNOSIS — S82251A Displaced comminuted fracture of shaft of right tibia, initial encounter for closed fracture: Secondary | ICD-10-CM | POA: Diagnosis present

## 2018-11-22 DIAGNOSIS — W3189XA Contact with other specified machinery, initial encounter: Secondary | ICD-10-CM | POA: Insufficient documentation

## 2018-11-22 DIAGNOSIS — S82209A Unspecified fracture of shaft of unspecified tibia, initial encounter for closed fracture: Secondary | ICD-10-CM | POA: Diagnosis present

## 2018-11-22 DIAGNOSIS — T1490XA Injury, unspecified, initial encounter: Secondary | ICD-10-CM

## 2018-11-22 DIAGNOSIS — S81811A Laceration without foreign body, right lower leg, initial encounter: Secondary | ICD-10-CM

## 2018-11-22 DIAGNOSIS — S82221A Displaced transverse fracture of shaft of right tibia, initial encounter for closed fracture: Secondary | ICD-10-CM

## 2018-11-22 HISTORY — PX: TIBIA IM NAIL INSERTION: SHX2516

## 2018-11-22 LAB — CBC WITH DIFFERENTIAL/PLATELET
Abs Immature Granulocytes: 0.03 10*3/uL (ref 0.00–0.07)
Basophils Absolute: 0 10*3/uL (ref 0.0–0.1)
Basophils Relative: 0 %
Eosinophils Absolute: 0 10*3/uL (ref 0.0–0.5)
Eosinophils Relative: 0 %
HCT: 39.4 % (ref 39.0–52.0)
Hemoglobin: 13.1 g/dL (ref 13.0–17.0)
Immature Granulocytes: 0 %
Lymphocytes Relative: 14 %
Lymphs Abs: 1.5 10*3/uL (ref 0.7–4.0)
MCH: 28.5 pg (ref 26.0–34.0)
MCHC: 33.2 g/dL (ref 30.0–36.0)
MCV: 85.7 fL (ref 80.0–100.0)
Monocytes Absolute: 0.5 10*3/uL (ref 0.1–1.0)
Monocytes Relative: 5 %
Neutro Abs: 8.6 10*3/uL — ABNORMAL HIGH (ref 1.7–7.7)
Neutrophils Relative %: 81 %
Platelets: 244 10*3/uL (ref 150–400)
RBC: 4.6 MIL/uL (ref 4.22–5.81)
RDW: 13.3 % (ref 11.5–15.5)
WBC: 10.6 10*3/uL — ABNORMAL HIGH (ref 4.0–10.5)
nRBC: 0 % (ref 0.0–0.2)

## 2018-11-22 LAB — BASIC METABOLIC PANEL
Anion gap: 8 (ref 5–15)
BUN: 10 mg/dL (ref 8–23)
CO2: 23 mmol/L (ref 22–32)
Calcium: 9.2 mg/dL (ref 8.9–10.3)
Chloride: 109 mmol/L (ref 98–111)
Creatinine, Ser: 1 mg/dL (ref 0.61–1.24)
GFR calc Af Amer: >60 mL/min (ref >60)
GFR calc non Af Amer: >60 mL/min (ref >60)
Glucose, Bld: 111 mg/dL — ABNORMAL HIGH (ref 70–99)
Potassium: 4.1 mmol/L (ref 3.5–5.1)
Sodium: 140 mmol/L (ref 135–145)

## 2018-11-22 LAB — SARS CORONAVIRUS 2 BY RT PCR (HOSPITAL ORDER, PERFORMED IN ~~LOC~~ HOSPITAL LAB): SARS Coronavirus 2: NEGATIVE

## 2018-11-22 SURGERY — INSERTION, INTRAMEDULLARY ROD, TIBIA
Anesthesia: General | Site: Leg Lower | Laterality: Right

## 2018-11-22 MED ORDER — PROPOFOL 10 MG/ML IV BOLUS
INTRAVENOUS | Status: DC | PRN
  Administered 2018-11-22: 150 mg via INTRAVENOUS

## 2018-11-22 MED ORDER — ONDANSETRON HCL 4 MG PO TABS: 4.0000 mg | ORAL_TABLET | Freq: Four times a day (QID) | ORAL | Status: DC | PRN

## 2018-11-22 MED ORDER — METHOCARBAMOL 1000 MG/10ML IJ SOLN
500.0000 mg | Freq: Four times a day (QID) | INTRAVENOUS | Status: DC | PRN
  Filled 2018-11-22: qty 5

## 2018-11-22 MED ORDER — PROPOFOL 10 MG/ML IV BOLUS
INTRAVENOUS | Status: AC
  Filled 2018-11-22: qty 20

## 2018-11-22 MED ORDER — TETANUS-DIPHTH-ACELL PERTUSSIS 5-2.5-18.5 LF-MCG/0.5 IM SUSP
0.5000 mL | Freq: Once | INTRAMUSCULAR | Status: AC
  Administered 2018-11-22: 10:00:00 0.5 mL via INTRAMUSCULAR
  Filled 2018-11-22: qty 0.5

## 2018-11-22 MED ORDER — METOCLOPRAMIDE HCL 5 MG PO TABS: 5.0000 mg | ORAL_TABLET | Freq: Three times a day (TID) | ORAL | Status: DC | PRN

## 2018-11-22 MED ORDER — LIDOCAINE-EPINEPHRINE (PF) 2 %-1:200000 IJ SOLN
10.0000 mL | Freq: Once | INTRAMUSCULAR | Status: AC
  Administered 2018-11-22: 10 mL
  Filled 2018-11-22: qty 20

## 2018-11-22 MED ORDER — MORPHINE SULFATE (PF) 4 MG/ML IV SOLN
INTRAVENOUS | Status: DC | PRN
  Administered 2018-11-22: 8 mg

## 2018-11-22 MED ORDER — CEFAZOLIN SODIUM-DEXTROSE 1-4 GM/50ML-% IV SOLN
1.0000 g | Freq: Four times a day (QID) | INTRAVENOUS | Status: AC
  Administered 2018-11-22 – 2018-11-23 (×2): 1 g via INTRAVENOUS
  Filled 2018-11-22 (×2): qty 50

## 2018-11-22 MED ORDER — 0.9 % SODIUM CHLORIDE (POUR BTL) OPTIME
TOPICAL | Status: DC | PRN
  Administered 2018-11-22: 1000 mL

## 2018-11-22 MED ORDER — FENTANYL CITRATE (PF) 100 MCG/2ML IJ SOLN
INTRAMUSCULAR | Status: DC | PRN
  Administered 2018-11-22 (×2): 50 ug via INTRAVENOUS
  Administered 2018-11-22: 100 ug via INTRAVENOUS
  Administered 2018-11-22: 50 ug via INTRAVENOUS

## 2018-11-22 MED ORDER — LACTATED RINGERS IV SOLN
INTRAVENOUS | Status: DC | PRN
  Administered 2018-11-22 (×2): via INTRAVENOUS

## 2018-11-22 MED ORDER — ONDANSETRON HCL 4 MG/2ML IJ SOLN: 4.0000 mg | Freq: Four times a day (QID) | INTRAMUSCULAR | Status: DC | PRN

## 2018-11-22 MED ORDER — PHENYLEPHRINE HCL (PRESSORS) 10 MG/ML IV SOLN
INTRAVENOUS | Status: DC | PRN
  Administered 2018-11-22 (×11): 100 ug via INTRAVENOUS

## 2018-11-22 MED ORDER — OXYCODONE HCL 5 MG PO TABS: 5.0000 mg | ORAL_TABLET | Freq: Once | ORAL | Status: DC | PRN

## 2018-11-22 MED ORDER — ONDANSETRON HCL 4 MG/2ML IJ SOLN
INTRAMUSCULAR | Status: AC
  Filled 2018-11-22: qty 2

## 2018-11-22 MED ORDER — CLONIDINE HCL (ANALGESIA) 100 MCG/ML EP SOLN
EPIDURAL | Status: AC
  Filled 2018-11-22: qty 10

## 2018-11-22 MED ORDER — HYDROMORPHONE HCL 1 MG/ML IJ SOLN: 0.5000 mg | INTRAMUSCULAR | Status: DC | PRN

## 2018-11-22 MED ORDER — METOCLOPRAMIDE HCL 5 MG/ML IJ SOLN: 5.0000 mg | Freq: Three times a day (TID) | INTRAMUSCULAR | Status: DC | PRN

## 2018-11-22 MED ORDER — OXYCODONE HCL 5 MG/5ML PO SOLN: 5.0000 mg | Freq: Once | ORAL | Status: DC | PRN

## 2018-11-22 MED ORDER — CEFAZOLIN SODIUM-DEXTROSE 2-4 GM/100ML-% IV SOLN
2.0000 g | INTRAVENOUS | Status: AC
  Administered 2018-11-22: 17:00:00 2 g via INTRAVENOUS
  Filled 2018-11-22: qty 100

## 2018-11-22 MED ORDER — MIDAZOLAM HCL 2 MG/2ML IJ SOLN
INTRAMUSCULAR | Status: AC
  Filled 2018-11-22: qty 2

## 2018-11-22 MED ORDER — BUPIVACAINE HCL (PF) 0.25 % IJ SOLN
INTRAMUSCULAR | Status: DC | PRN
  Administered 2018-11-22: 30 mL

## 2018-11-22 MED ORDER — CHLORHEXIDINE GLUCONATE 4 % EX LIQD
60.0000 mL | Freq: Once | CUTANEOUS | Status: DC
  Filled 2018-11-22: qty 60

## 2018-11-22 MED ORDER — PHENYLEPHRINE 40 MCG/ML (10ML) SYRINGE FOR IV PUSH (FOR BLOOD PRESSURE SUPPORT)
PREFILLED_SYRINGE | INTRAVENOUS | Status: AC
  Filled 2018-11-22: qty 10

## 2018-11-22 MED ORDER — MORPHINE SULFATE (PF) 4 MG/ML IV SOLN
INTRAVENOUS | Status: AC
  Filled 2018-11-22: qty 2

## 2018-11-22 MED ORDER — EPHEDRINE SULFATE 50 MG/ML IJ SOLN
INTRAMUSCULAR | Status: DC | PRN
  Administered 2018-11-22: 10 mg via INTRAVENOUS

## 2018-11-22 MED ORDER — ROCURONIUM BROMIDE 50 MG/5ML IV SOSY
PREFILLED_SYRINGE | INTRAVENOUS | Status: DC | PRN
  Administered 2018-11-22 (×2): 10 mg via INTRAVENOUS
  Administered 2018-11-22: 50 mg via INTRAVENOUS

## 2018-11-22 MED ORDER — ONDANSETRON HCL 4 MG/2ML IJ SOLN: 4.0000 mg | Freq: Once | INTRAMUSCULAR | Status: DC | PRN

## 2018-11-22 MED ORDER — OXYCODONE HCL 5 MG PO TABS: 5.0000 mg | ORAL_TABLET | ORAL | Status: DC | PRN

## 2018-11-22 MED ORDER — LACTATED RINGERS IV SOLN: INTRAVENOUS | Status: DC

## 2018-11-22 MED ORDER — FENTANYL CITRATE (PF) 100 MCG/2ML IJ SOLN: 25.0000 ug | INTRAMUSCULAR | Status: DC | PRN

## 2018-11-22 MED ORDER — ACETAMINOPHEN 325 MG PO TABS: 325.0000 mg | ORAL_TABLET | Freq: Four times a day (QID) | ORAL | Status: DC | PRN

## 2018-11-22 MED ORDER — TRAMADOL HCL 50 MG PO TABS
50.0000 mg | ORAL_TABLET | Freq: Four times a day (QID) | ORAL | Status: DC
  Administered 2018-11-22 – 2018-11-23 (×3): 50 mg via ORAL
  Filled 2018-11-22 (×3): qty 1

## 2018-11-22 MED ORDER — FENTANYL CITRATE (PF) 250 MCG/5ML IJ SOLN
INTRAMUSCULAR | Status: AC
  Filled 2018-11-22: qty 5

## 2018-11-22 MED ORDER — SUGAMMADEX SODIUM 200 MG/2ML IV SOLN
INTRAVENOUS | Status: DC | PRN
  Administered 2018-11-22: 200 mg via INTRAVENOUS

## 2018-11-22 MED ORDER — DOCUSATE SODIUM 100 MG PO CAPS
100.0000 mg | ORAL_CAPSULE | Freq: Two times a day (BID) | ORAL | Status: DC
  Administered 2018-11-23: 09:00:00 100 mg via ORAL
  Filled 2018-11-22: qty 1

## 2018-11-22 MED ORDER — MIDAZOLAM HCL 5 MG/5ML IJ SOLN
INTRAMUSCULAR | Status: DC | PRN
  Administered 2018-11-22: 2 mg via INTRAVENOUS

## 2018-11-22 MED ORDER — METHOCARBAMOL 500 MG PO TABS: 500.0000 mg | ORAL_TABLET | Freq: Four times a day (QID) | ORAL | Status: DC | PRN

## 2018-11-22 MED ORDER — CLONIDINE HCL (ANALGESIA) 100 MCG/ML EP SOLN
EPIDURAL | Status: DC | PRN
  Administered 2018-11-22: 1 mL

## 2018-11-22 MED ORDER — LIDOCAINE 2% (20 MG/ML) 5 ML SYRINGE
INTRAMUSCULAR | Status: DC | PRN
  Administered 2018-11-22: 60 mg via INTRAVENOUS

## 2018-11-22 MED ORDER — POVIDONE-IODINE 10 % EX SWAB: 2.0000 "application " | Freq: Once | CUTANEOUS | Status: DC

## 2018-11-22 SURGICAL SUPPLY — 62 items
BIT DRILL CALIBRATED 4.3X320MM (BIT) IMPLANT
BIT DRILL CROWE POINT TWST 4.3 (DRILL) IMPLANT
BLADE CLIPPER SURG (BLADE) ×2 IMPLANT
BLADE SURG 15 STRL LF DISP TIS (BLADE) ×1 IMPLANT
BLADE SURG 15 STRL SS (BLADE) ×2
BNDG COHESIVE 6X5 TAN STRL LF (GAUZE/BANDAGES/DRESSINGS) ×3 IMPLANT
BNDG ELASTIC 4X5.8 VLCR STR LF (GAUZE/BANDAGES/DRESSINGS) ×3 IMPLANT
BNDG ELASTIC 6X5.8 VLCR STR LF (GAUZE/BANDAGES/DRESSINGS) ×3 IMPLANT
COVER SURGICAL LIGHT HANDLE (MISCELLANEOUS) ×3 IMPLANT
COVER WAND RF STERILE (DRAPES) ×3 IMPLANT
DRAPE C-ARM 42X72 X-RAY (DRAPES) ×3 IMPLANT
DRAPE C-ARMOR (DRAPES) ×4 IMPLANT
DRAPE IMP U-DRAPE 54X76 (DRAPES) ×3 IMPLANT
DRAPE INCISE IOBAN 66X45 STRL (DRAPES) ×6 IMPLANT
DRAPE ORTHO SPLIT 77X108 STRL (DRAPES) ×4
DRAPE SURG ORHT 6 SPLT 77X108 (DRAPES) ×2 IMPLANT
DRAPE U-SHAPE 47X51 STRL (DRAPES) ×3 IMPLANT
DRILL CALIBRATED 4.3X320MM (BIT) ×3
DRILL CROWE POINT TWIST 4.3 (DRILL) ×6
DRSG AQUACEL AG ADV 3.5X 4 (GAUZE/BANDAGES/DRESSINGS) ×8 IMPLANT
DRSG AQUACEL AG ADV 3.5X 6 (GAUZE/BANDAGES/DRESSINGS) ×2 IMPLANT
DURAPREP 26ML APPLICATOR (WOUND CARE) ×5 IMPLANT
ELECT REM PT RETURN 9FT ADLT (ELECTROSURGICAL) ×3
ELECTRODE REM PT RTRN 9FT ADLT (ELECTROSURGICAL) ×1 IMPLANT
GLOVE BIOGEL PI IND STRL 8 (GLOVE) ×1 IMPLANT
GLOVE BIOGEL PI INDICATOR 8 (GLOVE) ×2
GLOVE SURG ORTHO 8.0 STRL STRW (GLOVE) ×3 IMPLANT
GOWN STRL REUS W/ TWL LRG LVL3 (GOWN DISPOSABLE) ×1 IMPLANT
GOWN STRL REUS W/ TWL XL LVL3 (GOWN DISPOSABLE) ×2 IMPLANT
GOWN STRL REUS W/TWL LRG LVL3 (GOWN DISPOSABLE) ×4
GOWN STRL REUS W/TWL XL LVL3 (GOWN DISPOSABLE) ×2
GUIDEPIN 3.2X17.5 THRD DISP (PIN) ×4 IMPLANT
GUIDEWIRE 2.6X80 BEAD TIP (WIRE) IMPLANT
GUIDWIRE 2.6X80 BEAD TIP (WIRE) ×3
KIT BASIN OR (CUSTOM PROCEDURE TRAY) ×3 IMPLANT
KIT TURNOVER KIT B (KITS) ×3 IMPLANT
MANIFOLD NEPTUNE II (INSTRUMENTS) ×3 IMPLANT
NAIL TIBIAL PHOENIX 9.0X360 (Nail) ×2 IMPLANT
NEEDLE 22X1 1/2 (OR ONLY) (NEEDLE) ×4 IMPLANT
NS IRRIG 1000ML POUR BTL (IV SOLUTION) ×3 IMPLANT
PACK GENERAL/GYN (CUSTOM PROCEDURE TRAY) ×3 IMPLANT
PACK UNIVERSAL I (CUSTOM PROCEDURE TRAY) ×3 IMPLANT
PAD ABD 7.5X8 STRL (GAUZE/BANDAGES/DRESSINGS) ×2 IMPLANT
PAD ARMBOARD 7.5X6 YLW CONV (MISCELLANEOUS) ×6 IMPLANT
PAD CAST 3X4 CTTN HI CHSV (CAST SUPPLIES) IMPLANT
PAD CAST 4YDX4 CTTN HI CHSV (CAST SUPPLIES) IMPLANT
PADDING CAST COTTON 3X4 STRL (CAST SUPPLIES) ×2
PADDING CAST COTTON 4X4 STRL (CAST SUPPLIES) ×2
SCREW CORT TI DBL LEAD 5X36 (Screw) ×2 IMPLANT
SCREW CORT TI DBL LEAD 5X40 (Screw) ×2 IMPLANT
SCREW CORT TI DBL LEAD 5X42 (Screw) ×2 IMPLANT
SCREW CORT TI DBL LEAD 5X46 (Screw) ×2 IMPLANT
SOL PREP POV-IOD 4OZ 10% (MISCELLANEOUS) ×2 IMPLANT
STOCKINETTE IMPERVIOUS LG (DRAPES) ×3 IMPLANT
SUT ETHILON 3 0 PS 1 (SUTURE) ×8 IMPLANT
SUT VIC AB 0 CT1 27 (SUTURE) ×2
SUT VIC AB 0 CT1 27XBRD ANBCTR (SUTURE) ×1 IMPLANT
SUT VIC AB 2-0 CT1 27 (SUTURE) ×6
SUT VIC AB 2-0 CT1 TAPERPNT 27 (SUTURE) ×1 IMPLANT
SYR CONTROL 10ML LL (SYRINGE) ×2 IMPLANT
TOWEL GREEN STERILE (TOWEL DISPOSABLE) ×3 IMPLANT
WATER STERILE IRR 1000ML POUR (IV SOLUTION) ×3 IMPLANT

## 2018-11-22 NOTE — ED Triage Notes (Addendum)
Pt reports slipping and his R leg got sucked under a machine at work. Large laceration to R shin with bleeding controlled with pressure, mild edema around lac, no other injuries. Pt unsure of last tetanus.

## 2018-11-22 NOTE — Anesthesia Postprocedure Evaluation (Signed)
Anesthesia Post Note  Patient: Justin Wiggins  Procedure(s) Performed: INTRAMEDULLARY (IM) NAIL TIBIAL (Right Leg Lower)     Patient location during evaluation: PACU Anesthesia Type: General Level of consciousness: awake and alert Pain management: pain level controlled Vital Signs Assessment: post-procedure vital signs reviewed and stable Respiratory status: spontaneous breathing, nonlabored ventilation, respiratory function stable and patient connected to nasal cannula oxygen Cardiovascular status: blood pressure returned to baseline and stable Postop Assessment: no apparent nausea or vomiting Anesthetic complications: no    Last Vitals:  Vitals:   11/22/18 2114 11/22/18 2335  BP: 115/84 107/66  Pulse: 77 83  Resp: 18 16  Temp: 36.7 C 36.7 C  SpO2: 98% 99%    Last Pain:  Vitals:   11/22/18 2335  TempSrc: Oral  PainSc: 0-No pain                 Somaly Marteney DAVID

## 2018-11-22 NOTE — Anesthesia Preprocedure Evaluation (Signed)
Anesthesia Evaluation  Patient identified by MRN, date of birth, ID band Patient awake    Reviewed: Allergy & Precautions, NPO status , Patient's Chart, lab work & pertinent test results  Airway Mallampati: II  TM Distance: >3 FB Neck ROM: Full    Dental  (+) Edentulous Upper, Dental Advisory Given   Pulmonary    breath sounds clear to auscultation       Cardiovascular  Rhythm:Regular Rate:Normal     Neuro/Psych    GI/Hepatic   Endo/Other    Renal/GU      Musculoskeletal   Abdominal   Peds  Hematology   Anesthesia Other Findings   Reproductive/Obstetrics                             Anesthesia Physical Anesthesia Plan  ASA: II  Anesthesia Plan: General   Post-op Pain Management:    Induction: Intravenous  PONV Risk Score and Plan: Ondansetron and Dexamethasone  Airway Management Planned: Oral ETT  Additional Equipment:   Intra-op Plan:   Post-operative Plan: Extubation in OR  Informed Consent: I have reviewed the patients History and Physical, chart, labs and discussed the procedure including the risks, benefits and alternatives for the proposed anesthesia with the patient or authorized representative who has indicated his/her understanding and acceptance.     Dental advisory given  Plan Discussed with: CRNA and Anesthesiologist  Anesthesia Plan Comments:         Anesthesia Quick Evaluation

## 2018-11-22 NOTE — ED Provider Notes (Addendum)
[facility] Provider Note   CSN: [id number] Arrival date & time: 11/22/18  [id number]     History   Chief Complaint Chief Complaint  Patient presents with  . Extremity Laceration  . Leg Injury    HPI Justin Wiggins is a 68 y.o. male with no past medical history presents to the ED with a acute onset right leg injury.  Patient states that he was unloading a concrete mixer from the back of a trailer when he slipped in the mixer landed on his leg, resulting in a laceration over his right shin.  He is unsure of his last tetanus.  He has not tried ambulating or weightbearing on the right leg.  He complains of no ankle or knee discomfort.  Patient denies any fevers, chills, recent infection, numbness, tingling, or other neurologic deficits.  He has not taken anything for his pain.  Currently complains of 4 out of 10 pain.    HPI  History reviewed. No pertinent past medical history.  There are no active problems to display for this patient.   History reviewed. No pertinent surgical history.      Home Medications    Prior to Admission medications   Not on File    Family History No family history on file.  Social History Social History   Tobacco Use  . Smoking status: Not on file  Substance Use Topics  . Alcohol use: Not on file  . Drug use: Not on file     Allergies   Patient has no known allergies.   Review of Systems Review of Systems  All other systems reviewed and are negative.    Physical Exam Updated Vital Signs BP 123/90 (BP Location: Left Arm)   Pulse 72   Temp 98 F (36.7 C)   Resp 16   SpO2 98%   Physical Exam Vitals signs and nursing note reviewed. Exam conducted with a chaperone present.  Constitutional:      Appearance: Normal appearance.  HENT:     Head: Normocephalic and atraumatic.  Eyes:     General: No scleral icterus.    Conjunctiva/sclera: Conjunctivae normal.  Pulmonary:     Effort: Pulmonary  effort is normal.  Musculoskeletal:     Comments: Right leg: 6 cm vertical, bleeding laceration along tibia.  It is able to be approximated.  Distal pulses and sensation intact.  Cap refill intact.  Swelling and TTP distal to laceration.  Skin:    General: Skin is dry.  Neurological:     Mental Status: He is alert.     GCS: GCS eye subscore is 4. GCS verbal subscore is 5. GCS motor subscore is 6.  Psychiatric:        Mood and Affect: Mood normal.        Behavior: Behavior normal.        Thought Content: Thought content normal.      ED Treatments / Results  Labs (all labs ordered are listed, but only abnormal results are displayed) Labs Reviewed  SARS CORONAVIRUS 2 BY RT PCR (HOSPITAL ORDER, PERFORMED IN [facility])  CBC WITH DIFFERENTIAL/PLATELET  BASIC METABOLIC PANEL    EKG None  Radiology Dg Tibia/fibula Right  Result Date: 11/22/2018 CLINICAL DATA:  Right lower leg laceration EXAM: RIGHT TIBIA AND FIBULA - 2 VIEW COMPARISON:  None. FINDINGS: There is an acute, mildly comminuted, transversely oriented fracture of the mid right tibial diaphysis. Minimal displacement evident on lateral view.  No angulation. Alignment at the knee and ankle are anatomic. The fibula is intact without fracture. There is mild soft tissue swelling at the fracture site. No radiopaque foreign body. IMPRESSION: Acute transversely-oriented fracture of the mid right tibial diaphysis with minimal displacement. No angulation. No radiopaque foreign body. Electronically Signed   By: Duanne Guess M.D.   On: 11/22/2018 10:24    Procedures .Marland KitchenLaceration Repair  Date/Time: 11/22/2018 12:08 PM Performed by: Lorelee New, PA-C Authorized by: Lorelee New, PA-C   Consent:    Consent obtained:  Verbal   Consent given by:  Patient   Risks discussed:  Infection, need for additional repair, pain, poor cosmetic result, poor wound healing, retained foreign body, tendon damage, vascular  damage and nerve damage   Alternatives discussed:  No treatment and delayed treatment Universal protocol:    Procedure explained and questions answered to patient or proxy's satisfaction: yes     Relevant documents present and verified: yes     Test results available and properly labeled: yes     Imaging studies available: yes     Required blood products, implants, devices, and special equipment available: yes     Site/side marked: yes     Immediately prior to procedure, a time out was called: yes     Patient identity confirmed:  Verbally with patient Anesthesia (see MAR for exact dosages):    Anesthesia method:  Local infiltration   Local anesthetic:  Lidocaine 2% WITH epi Laceration details:    Location:  Leg   Length (cm):  5 Repair type:    Repair type:  Simple Pre-procedure details:    Preparation:  Patient was prepped and draped in usual sterile fashion Exploration:    Hemostasis achieved with:  Direct pressure and epinephrine Treatment:    Area cleansed with:  Saline and soap and water   Amount of cleaning:  Extensive   Irrigation solution:  Sterile saline   Irrigation volume:  250   Irrigation method:  Pressure wash   Visualized foreign bodies/material removed: no   Skin repair:    Repair method:  Sutures   Suture size:  4-0   Suture material:  Prolene   Suture technique:  Simple interrupted Post-procedure details:    Dressing:  Sterile dressing   (including critical care time)  Medications Ordered in ED Medications  chlorhexidine (HIBICLENS) 4 % liquid 4 application (has no administration in time range)  povidone-iodine 10 % swab 2 application (has no administration in time range)  ceFAZolin (ANCEF) IVPB 2g/100 mL premix (has no administration in time range)  Tdap (BOOSTRIX) injection 0.5 mL (0.5 mLs Intramuscular Given 11/22/18 1027)  lidocaine-EPINEPHrine (XYLOCAINE W/EPI) 2 %-1:200000 (PF) injection 10 mL (10 mLs Infiltration Given by Other 11/22/18 1229)      Initial Impression / Assessment and Plan / ED Course  I have reviewed the triage vital signs and the nursing notes.  Pertinent labs & imaging results that were available during my care of the patient were reviewed by me and considered in my medical decision making (see chart for details).        Patient has a midshaft tibial fracture.  He will likely require IM rod placement.  Consulted with Charma Igo, PA who evaluated the patient will determine next steps.  Irrigated and cleaned wound.  Sutured using 4-0 Prolene.  5 stitches placed.  Patient tolerated procedure well.  Patient is neurovascular intact.  Vital signs within normal months aside from mildly elevated BP.  Patient is not in any significant discomfort.  On reassessment, patient still does not complain of any pain symptoms.  He is not in any acute acute distress.  Patient will be admitted and will have surgery at 4 PM today.  Per PA Dellis Filbert, we can hold off on antibiotics until he goes to the OR.   Final Clinical Impressions(s) / ED Diagnoses   Final diagnoses:  Laceration of right lower leg, initial encounter    ED Discharge Orders    None       Corena Herter, PA-C 11/22/18 1212    Corena Herter, PA-C 11/22/18 [place], [place], DO 11/22/18 1713

## 2018-11-22 NOTE — Brief Op Note (Signed)
   11/22/2018  7:39 PM  PATIENT:  Justin Wiggins  68 y.o. male  PRE-OPERATIVE DIAGNOSIS:  Right tibia fx  POST-OPERATIVE DIAGNOSIS:  Right tibia fx  PROCEDURE:  Procedure(s): INTRAMEDULLARY (IM) NAIL TIBIAL  SURGEON:  Surgeon(s): Marlou Sa, Tonna Corner, MD  ASSISTANT: magnant pa  ANESTHESIA:   general  EBL: 50 ml    Total I/O In: 1000 [I.V.:1000] Out: 50 [Blood:50]  BLOOD ADMINISTERED: none  DRAINS: none   LOCAL MEDICATIONS USED:  Marcaine mso4 clonidine  SPECIMEN:  No Specimen  COUNTS:  YES  TOURNIQUET:  * No tourniquets in log *  DICTATION: .Other Dictation: Dictation Number [id number]  PLAN OF CARE: Admit for overnight observation  PATIENT DISPOSITION:  PACU - hemodynamically stable

## 2018-11-22 NOTE — Transfer of Care (Signed)
Immediate Anesthesia Transfer of Care Note  Patient: Khai Arrona  Procedure(s) Performed: INTRAMEDULLARY (IM) NAIL TIBIAL (Right Leg Lower)  Patient Location: PACU  Anesthesia Type:General  Level of Consciousness: awake  Airway & Oxygen Therapy: Patient Spontanous Breathing  Post-op Assessment: Report given to RN and Post -op Vital signs reviewed and stable  Post vital signs: Reviewed and stable  Last Vitals:  Vitals Value Taken Time  BP 113/72 11/22/18 1944  Temp    Pulse 86 11/22/18 1947  Resp 12 11/22/18 1947  SpO2 94 % 11/22/18 1947  Vitals shown include unvalidated device data.  Last Pain:  Vitals:   11/22/18 1945  PainSc: (P) Asleep         Complications: No apparent anesthesia complications

## 2018-11-22 NOTE — Anesthesia Procedure Notes (Signed)
Procedure Name: Intubation Date/Time: 11/22/2018 5:41 PM Performed by: Purvis Kilts, CRNA Pre-anesthesia Checklist: Patient identified, Emergency Drugs available, Suction available, Patient being monitored and Timeout performed Patient Re-evaluated:Patient Re-evaluated prior to induction Oxygen Delivery Method: Circle system utilized Preoxygenation: Pre-oxygenation with 100% oxygen Induction Type: IV induction Ventilation: Mask ventilation without difficulty Laryngoscope Size: Mac and 4 Grade View: Grade II Tube type: Oral Laser Tube: Cuffed inflated with minimal occlusive pressure - saline Tube size: 7.5 mm Number of attempts: 1 Airway Equipment and Method: Stylet Placement Confirmation: ETT inserted through vocal cords under direct vision,  positive ETCO2 and breath sounds checked- equal and bilateral Secured at: 21 cm Tube secured with: Tape Dental Injury: Teeth and Oropharynx as per pre-operative assessment

## 2018-11-22 NOTE — ED Notes (Signed)
Report given to Melvenia Beam RN

## 2018-11-22 NOTE — Consult Note (Signed)
Reason for Consult:Right tibia fx Referring Physician: A Justin Wiggins is an 68 y.o. male.  HPI: Justin Wiggins was unloading Justin mixer when it fell on his right leg. He had immediate pain and could not bear weight. He came to the ED where x-rays showed Justin tibia fx and orthopedic surgery was consulted. He c/o localized pain to the area.  History reviewed. No pertinent past medical history.  History reviewed. No pertinent surgical history.  No family history on file.  Social History:  has no history on file for tobacco, alcohol, and drug.  Allergies: No Known Allergies  Medications: I have reviewed the patient's current medications.  No results found for this or any previous visit (from the past 48 hour(s)).  Dg Tibia/fibula Right  Result Date: 11/22/2018 CLINICAL DATA:  Right lower leg laceration EXAM: RIGHT TIBIA AND FIBULA - 2 VIEW COMPARISON:  None. FINDINGS: There is an acute, mildly comminuted, transversely oriented fracture of the mid right tibial diaphysis. Minimal displacement evident on lateral view. No angulation. Alignment at the knee and ankle are anatomic. The fibula is intact without fracture. There is mild soft tissue swelling at the fracture site. No radiopaque foreign body. IMPRESSION: Acute transversely-oriented fracture of the mid right tibial diaphysis with minimal displacement. No angulation. No radiopaque foreign body. Electronically Signed   By: Justin Wiggins M.D.   On: 11/22/2018 10:24    Review of Systems  Constitutional: Negative for weight loss.  HENT: Negative for ear discharge, ear pain, hearing loss and tinnitus.   Eyes: Negative for blurred vision, double vision, photophobia and pain.  Respiratory: Negative for cough, sputum production and shortness of breath.   Cardiovascular: Negative for chest pain.  Gastrointestinal: Negative for abdominal pain, nausea and vomiting.  Genitourinary: Negative for dysuria, flank pain, frequency and urgency.   Musculoskeletal: Positive for joint pain (Right lower leg). Negative for back pain, falls, myalgias and neck pain.  Neurological: Negative for dizziness, tingling, sensory change, focal weakness, loss of consciousness and headaches.  Endo/Heme/Allergies: Does not bruise/bleed easily.  Psychiatric/Behavioral: Negative for depression, memory loss and substance abuse. The patient is not nervous/anxious.    Blood pressure (!) 165/109, pulse 73, temperature 98 F (36.7 C), resp. rate 15, SpO2 99 %. Physical Exam  Constitutional: He appears well-developed and well-nourished. No distress.  HENT:  Head: Normocephalic and atraumatic.  Eyes: Conjunctivae are normal. Right eye exhibits no discharge. Left eye exhibits no discharge. No scleral icterus.  Neck: Normal range of motion.  Cardiovascular: Normal rate and regular rhythm.  Respiratory: Effort normal. No respiratory distress.  Musculoskeletal:     Comments: RLE Longitudinal laceration mid lower leg, no ecchymosis or rash  Mod TTP  No knee or ankle effusion  Knee stable to varus/ valgus and anterior/posterior stress  Sens DPN, SPN, TN intact  Motor EHL, ext, flex, evers 5/5  DP 2+, PT 1+, No significant edema  Neurological: He is alert.  Skin: Skin is warm and dry. He is not diaphoretic.  Psychiatric: He has Justin normal mood and affect. His behavior is normal.    Assessment/Plan: Right tibia fx -- Plan IMN this afternoon by Dr. Marlou Sa. NPO until then. Urinary frequency    Lisette Abu, PA-C Orthopedic Surgery [phone] 11/22/2018, 11:11 AM

## 2018-11-23 ENCOUNTER — Other Ambulatory Visit: Payer: Self-pay

## 2018-11-23 ENCOUNTER — Encounter (HOSPITAL_COMMUNITY): Payer: Self-pay

## 2018-11-23 MED ORDER — ASPIRIN 81 MG PO CHEW
81.0000 mg | CHEWABLE_TABLET | Freq: Every day | ORAL | Status: DC
  Administered 2018-11-23: 09:00:00 81 mg via ORAL
  Filled 2018-11-23: qty 1

## 2018-11-23 MED ORDER — METHOCARBAMOL 500 MG PO TABS: 500.0000 mg | ORAL_TABLET | Freq: Four times a day (QID) | ORAL | 0 refills | Status: AC | PRN

## 2018-11-23 MED ORDER — OXYCODONE HCL 5 MG PO TABS: 5.0000 mg | ORAL_TABLET | ORAL | 0 refills | Status: AC | PRN

## 2018-11-23 MED ORDER — ASPIRIN EC 81 MG PO TBEC: 81.0000 mg | DELAYED_RELEASE_TABLET | Freq: Every day | ORAL | 0 refills | Status: DC

## 2018-11-23 MED ORDER — DOCUSATE SODIUM 100 MG PO CAPS: 100.0000 mg | ORAL_CAPSULE | Freq: Two times a day (BID) | ORAL | 0 refills | Status: AC

## 2018-11-23 NOTE — Discharge Summary (Signed)
Patient ID: Justin Wiggins MRN: [id number] DOB/AGE: September 29, 1950 68 y.o.  Admit date: 11/22/2018 Discharge date: 11/23/2018  Admission Diagnoses:  <principal problem not specified>  Discharge Diagnoses:  Active Problems:   Tibial fracture   History reviewed. No pertinent past medical history.  Surgeries: Procedure(s): INTRAMEDULLARY (IM) NAIL TIBIAL on 11/22/2018   Consultants (if any): Treatment Team:  Meredith Pel, MD  Discharged Condition: Improved  Hospital Course: Kunta Hilleary is an 68 y.o. male who was admitted 11/22/2018 with a diagnosis of <principal problem not specified> and went to the operating room on 11/22/2018 and underwent the above named procedures.    He was given perioperative antibiotics:  Anti-infectives (From admission, onward)   Start     Dose/Rate Route Frequency Ordered Stop   11/22/18 2300  ceFAZolin (ANCEF) IVPB 1 g/50 mL premix     1 g 100 mL/hr over 30 Minutes Intravenous Every 6 hours 11/22/18 2123 11/23/18 0815   11/22/18 1145  ceFAZolin (ANCEF) IVPB 2g/100 mL premix     2 g 200 mL/hr over 30 Minutes Intravenous On call to O.R. 11/22/18 1142 11/22/18 1723    .  He was given sequential compression devices, early ambulation, and appropriate chemoprophylaxis for DVT prophylaxis.  He benefited maximally from the hospital stay and there were no complications.    Recent vital signs:  Vitals:   11/23/18 0354 11/23/18 0817  BP: 114/67 108/71  Pulse: 78 90  Resp: 16   Temp: 98.7 F (37.1 C) 98.3 F (36.8 C)  SpO2: 98% 99%    Recent laboratory studies:  Lab Results  Component Value Date   HGB 13.1 11/22/2018   Lab Results  Component Value Date   WBC 10.6 (H) 11/22/2018   PLT 244 11/22/2018   No results found for: INR Lab Results  Component Value Date   NA 140 11/22/2018   K 4.1 11/22/2018   CL 109 11/22/2018   CO2 23 11/22/2018   BUN 10 11/22/2018   CREATININE 1.00 11/22/2018   GLUCOSE 111 (H) 11/22/2018     Discharge Medications:   Allergies as of 11/23/2018   No Known Allergies     Medication List    TAKE these medications   aspirin EC 81 MG tablet Take 1 tablet (81 mg total) by mouth daily.   docusate sodium 100 MG capsule Commonly known as: COLACE Take 1 capsule (100 mg total) by mouth 2 (two) times daily.   methocarbamol 500 MG tablet Commonly known as: ROBAXIN Take 1 tablet (500 mg total) by mouth every 6 (six) hours as needed for muscle spasms.   oxyCODONE 5 MG immediate release tablet Commonly known as: Oxy IR/ROXICODONE Take 1-2 tablets (5-10 mg total) by mouth every 4 (four) hours as needed for moderate pain (pain score 4-6).       Diagnostic Studies: Dg Tibia/fibula Right  Result Date: 11/22/2018 CLINICAL DATA:  Right tibial intramedullary nail placement EXAM: RIGHT TIBIA AND FIBULA - 2 VIEW; DG C-ARM 1-60 MIN COMPARISON:  Same day radiographs FINDINGS: Patient is post open reduction, internal fixation of a right tibial diaphyseal fracture with placement of a tibial intramedullary rod secured proximally and distally threaded screws. Post fixation alignment is near anatomic. Expected postsurgical soft tissue changes are noted at the knee. No additional fractures are seen. IMPRESSION: Interval open reduction and internal fixation of a right tibial diaphyseal fracture with placement of a tibial intramedullary rod. No acute complication. Electronically Signed   By: Elwin Sleight.D.  On: 11/22/2018 19:34   Dg Tibia/fibula Right  Result Date: 11/22/2018 CLINICAL DATA:  Right lower leg laceration EXAM: RIGHT TIBIA AND FIBULA - 2 VIEW COMPARISON:  None. FINDINGS: There is an acute, mildly comminuted, transversely oriented fracture of the mid right tibial diaphysis. Minimal displacement evident on lateral view. No angulation. Alignment at the knee and ankle are anatomic. The fibula is intact without fracture. There is mild soft tissue swelling at the fracture site. No radiopaque  foreign body. IMPRESSION: Acute transversely-oriented fracture of the mid right tibial diaphysis with minimal displacement. No angulation. No radiopaque foreign body. Electronically Signed   By: Duanne Guess M.D.   On: 11/22/2018 10:24   Dg C-arm 1-60 Min  Result Date: 11/22/2018 CLINICAL DATA:  Right tibial intramedullary nail placement EXAM: RIGHT TIBIA AND FIBULA - 2 VIEW; DG C-ARM 1-60 MIN COMPARISON:  Same day radiographs FINDINGS: Patient is post open reduction, internal fixation of a right tibial diaphyseal fracture with placement of a tibial intramedullary rod secured proximally and distally threaded screws. Post fixation alignment is near anatomic. Expected postsurgical soft tissue changes are noted at the knee. No additional fractures are seen. IMPRESSION: Interval open reduction and internal fixation of a right tibial diaphyseal fracture with placement of a tibial intramedullary rod. No acute complication. Electronically Signed   By: Kreg Shropshire M.D.   On: 11/22/2018 19:34    Disposition: Discharge disposition: 01-Home or Self Care       Discharge Instructions    Call MD / Call 911   Complete by: As directed    If you experience chest pain or shortness of breath, CALL 911 and be transported to the hospital emergency room.  If you develope a fever above 101 F, pus (white drainage) or increased drainage or redness at the wound, or calf pain, call your surgeon's office.   Call MD / Call 911   Complete by: As directed    If you experience chest pain or shortness of breath, CALL 911 and be transported to the hospital emergency room.  If you develope a fever above 101.5 F, pus (white drainage) or increased drainage or redness at the wound, or calf pain, call your surgeon's office.   Constipation Prevention   Complete by: As directed    Drink plenty of fluids.  Prune juice may be helpful.  You may use a stool softener, such as Colace (over the counter) 100 mg twice a day.  Use  MiraLax (over the counter) for constipation as needed.   Constipation Prevention   Complete by: As directed    Drink plenty of fluids.  Prune juice may be helpful.  You may use a stool softener, such as Colace (over the counter) 100 mg twice a day.  Use MiraLax (over the counter) for constipation as needed.   Diet - low sodium heart healthy   Complete by: As directed    Discharge instructions   Complete by: As directed    Weightbearing as tolerated right leg Okay to shower dressings are waterproof Knee range of motion as tolerated Return to clinic in 10 days for suture removal   Driving restrictions   Complete by: As directed    No driving while taking narcotic pain meds.   Increase activity slowly as tolerated   Complete by: As directed    Increase activity slowly as tolerated   Complete by: As directed          Signed: Glee Arvin 11/23/2018, 8:47 AM

## 2018-11-23 NOTE — Op Note (Signed)
[name], [name] MEDICAL RECORD TD:[id number] ACCOUNT [id number] DATE OF BIRTH:[birth year] FACILITY: [facility] LOCATION: [facility] PHYSICIAN:GREGORY Randel Pigg, MD  OPERATIVE REPORT  DATE OF PROCEDURE:  11/22/2018  PREOPERATIVE DIAGNOSIS:  Right tibial fracture.  POSTOPERATIVE DIAGNOSIS:  Right tibial fracture.  PROCEDURE:  Right tibial IM nail using [employer] [place] nail 9 mm x 36 mm with 1 proximal and 2 distal interlocking screws.  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  He presents for operative management after explanation of risks and benefits.  PROCEDURE IN DETAIL:  The patient was brought to the operating room where general anesthetic was induced.  Preoperative antibiotics administered.  Timeout was called.  Right leg was prescrubbed with alcohol and Betadine, allowed to air dry.  Prep with  DuraPrep solution and draped in a sterile manner.  The patient did have a laceration beginning about 3 fingerbreadths below the tibial tubercle extending about 9 cm.  This was approximately 9 cm away from the actual fracture and did not communicate with  the fracture.  This was essentially a linear laceration.  It had been previously irrigated and closed in the ER.  This was closed loosely.  Charlie Pitter was used to cover the operative field after sterile prepping and draping.  An incision was made just below  the inferior pole of the patella.  Skin and subcutaneous tissue were sharply divided.  Patellar tendon split.  Guide pin placed and confirmed in good location on the AP and lateral planes.  Proximal reaming performed.  A guide pin then placed across the  fracture site and the nail length was obtained.  Reaming was performed up to 10.5 mm with good cortical chatter obtained at 9 mm.  A 9 x 36 nail was then placed with 1 proximal interlocking screw with  good purchase obtained and 2 distal interlocking  screws obtained.  Correct screw length and alignment was confirmed in the AP and lateral planes under fluoroscopy.  Fracture was well aligned with cortical contact present.  At this time, thorough irrigation was performed in the interlocking screw  incisions as well as in the knee incision.  All incisions were anesthetized using combination of Marcaine, morphine, clonidine.  The incision closed using 0 Vicryl suture to close the patellar tendon split followed by interrupted inverted 2-0 Vicryl  suture and 3-0 nylon.  Portal incisions for the interlocking screws were closed using 3-0 Vicryl and 3-0 nylon.  Aquacel dressing was placed along with a soft dressing.  The fibular not fractured and the inherent stability of this fracture should allow  for weightbearing as tolerated and thus a splint was not applied.  The patient will be discharged tomorrow, weightbearing as tolerated on the right lower extremity with crutches.  Therapy consult first.  Follow up with [place] in about 10 days for clinical  recheck and suture removal.  Luke's assistance was required at all times during the case for opening and closing, limb positioning, drilling screw placement nail placement.  His assistance was of medical necessity.  TN/NUANCE  D:11/22/2018 T:11/23/2018 JOB:[id number]

## 2018-11-23 NOTE — Progress Notes (Signed)
Discharge: Pt d/c from room via wheelchair, Family member with the pt. Discharge instructions given to the patient and family members.  No questions from pt, reintegrated to the pt to call dr office on Monday to get a follow up appt. Ptssed in street clothes and left with discharge papers and prescriptions in hand. IV d/ced, and no complaints of pain or discomfort.  Walker given to the pt to take home to asst in ambulating when he gets home

## 2018-11-23 NOTE — Progress Notes (Signed)
Patient for discharge later today Weightbearing as tolerated right lower extremity Prescriptions sent in already Physical therapy first then discharge Follow-up in 10 days for suture removal

## 2018-11-23 NOTE — Progress Notes (Signed)
   Subjective:  Patient reports pain as moderate.     Objective:   VITALS:   Vitals:   11/22/18 2114 11/22/18 2335 11/23/18 0354 11/23/18 0817  BP: 115/84 107/66 114/67 108/71  Pulse: 77 83 78 90  Resp: 18 16 16    Temp: 98 F (36.7 C) 98.1 F (36.7 C) 98.7 F (37.1 C) 98.3 F (36.8 C)  TempSrc: Oral Oral Oral Oral  SpO2: 98% 99% 98% 99%  Weight:      Height:        Very pleasant Comfortable in bed Dressings c/d/i Compartments soft Bounding pulses   Lab Results  Component Value Date   WBC 10.6 (H) 11/22/2018   HGB 13.1 11/22/2018   HCT 39.4 11/22/2018   MCV 85.7 11/22/2018   PLT 244 11/22/2018     Assessment/Plan:  1 Day Post-Op   - Up with PT - home after PT - DVT ppx - SCDs, ambulation, aspirin - WBAT operative extremity - Pain controlled - Discharge planning - home after PT - Rx sent to pharmacy  Eduard Roux 11/23/2018, 8:45 AM [phone]

## 2018-11-23 NOTE — Plan of Care (Signed)
  Problem: Education: Goal: Knowledge of General Education information will improve Description: Including pain rating scale, medication(s)/side effects and non-pharmacologic comfort measures Outcome: Progressing   Problem: Clinical Measurements: Goal: Will remain free from infection Outcome: Progressing   Problem: Activity: Goal: Risk for activity intolerance will decrease Outcome: Progressing   Problem: Coping: Goal: Level of anxiety will decrease Outcome: Progressing   Problem: Pain Managment: Goal: General experience of comfort will improve Outcome: Progressing   Problem: Skin Integrity: Goal: Risk for impaired skin integrity will decrease Outcome: Progressing   

## 2018-11-23 NOTE — TOC Transition Note (Addendum)
Transition of Care [facility]) - CM/SW Discharge Note   Patient Details  Name: Caulder Wehner MRN: [id number] Date of Birth: 1950-07-25  Transition of Care [facility]) CM/SW Contact:  Claudie Leach, RN Phone Number: [phone] 11/23/2018, 12:55 PM   Clinical Narrative:    Pt to d/c home.  Patient states he will be able to pay for prescriptions.  DME ordered and will be delivered to room prior to d/c.    Final next level of care: Home/Self Care     Patient Goals and CMS Choice     Choice offered to / list presented to : Patient   Discharge Plan and Services                DME Arranged: Walker rolling DME Agency: [employer] Date DME Agency Contacted: 11/23/18 Time DME Agency Contacted: 1000 Representative spoke with at DME Agency: Bertrum Sol

## 2018-11-23 NOTE — Progress Notes (Signed)
Pt alert and oriented x4, no complaints of pain or discomfort.  Bed in low position, call bell within reach.  Bed alarms on and functioning.  Assessment done and charted.  Will continue to monitor and do hourly rounding throughout the shift 

## 2018-11-23 NOTE — Plan of Care (Signed)
  Problem: Education: Goal: Knowledge of General Education information will improve Description Including pain rating scale, medication(s)/side effects and non-pharmacologic comfort measures Outcome: Progressing   

## 2018-11-23 NOTE — Evaluation (Signed)
Physical Therapy Evaluation Patient Details Name: Justin Wiggins MRN: [id number] DOB: Oct 24, 1950 Today's Date: 11/23/2018   History of Present Illness  Pt is a 68 y/o male admitted after a work injury in which he sustained a R tibial fx, now s/p IM nail. No pertinent PMH.    Clinical Impression  Pt presented supine in bed with HOB elevated, awake and willing to participate in therapy session. Prior to admission, pt reported that he was independent with all functional mobility and ADLs. Pt lives with his mother in a two level house with a couple of steps to enter. At the time of evaluation, pt limited secondary to R LE pain. Overall he was at a min guard level with mobility using a RW to ambulate. Pt participated in stair training as well this session with cueing and min guard. PT will continue to follow acutely as per PT POC.    Follow Up Recommendations No PT follow up;Supervision for mobility/OOB    Equipment Recommendations  Rolling walker with 5" wheels    Recommendations for Other Services       Precautions / Restrictions Precautions Precautions: Fall Restrictions Weight Bearing Restrictions: Yes RLE Weight Bearing: Weight bearing as tolerated      Mobility  Bed Mobility Overal bed mobility: Needs Assistance Bed Mobility: Supine to Sit     Supine to sit: Min guard     General bed mobility comments: increased time and effort, min guard for safety; used bilateral UEs to move R LE off of bed  Transfers Overall transfer level: Needs assistance Equipment used: Rolling walker (2 wheeled) Transfers: Sit to/from Stand Sit to Stand: Min guard         General transfer comment: PT demonstrated and instructed in technique and safe hand placement; min guard for safety with transitional movement  Ambulation/Gait Ambulation/Gait assistance: Min guard Gait Distance (Feet): 20 Feet Assistive device: Rolling walker (2 wheeled) Gait Pattern/deviations: Step-to  pattern;Decreased step length - right;Decreased step length - left;Decreased stance time - right;Decreased stride length;Decreased dorsiflexion - right;Decreased weight shift to right;Antalgic Gait velocity: decreased   General Gait Details: pt with low tolerance to accepting weight through R LE during gait; pt using more of a TDWB'ing on R; no LOB or need for physical assistance  Stairs Stairs: Yes Stairs assistance: Min guard Stair Management: One rail Left;Step to pattern;Forwards;No rails;Backwards;With walker Number of Stairs: 2(x2 trials) General stair comments: pt first performing forwards with L handrail to simulate home set-up; however, pt having great difficulty ascending. Then had pt perform with RW backwards and he was able to complete successfully and preferred that method  Wheelchair Mobility    Modified Rankin (Stroke Patients Only)       Balance Overall balance assessment: Needs assistance Sitting-balance support: Feet supported Sitting balance-Leahy Scale: Good     Standing balance support: Bilateral upper extremity supported;Single extremity supported Standing balance-Leahy Scale: Poor                               Pertinent Vitals/Pain Pain Assessment: 0-10 Pain Score: 3  Pain Location: R LE Pain Descriptors / Indicators: Sore;Guarding Pain Intervention(s): Monitored during session;Repositioned    Home Living Family/patient expects to be discharged to:: Private residence Living Arrangements: Parent Available Help at Discharge: Family Type of Home: House Home Access: Stairs to enter Entrance Stairs-Rails: Left Entrance Stairs-Number of Steps: 2 Home Layout: Two level Home Equipment: None  Prior Function Level of Independence: Independent               Hand Dominance        Extremity/Trunk Assessment   Upper Extremity Assessment Upper Extremity Assessment: Overall WFL for tasks assessed    Lower Extremity  Assessment Lower Extremity Assessment: RLE deficits/detail RLE Deficits / Details: pt with decreased strength and ROM limitations secondary to post-op pain and weakness RLE: Unable to fully assess due to pain    Cervical / Trunk Assessment Cervical / Trunk Assessment: Normal  Communication   Communication: No difficulties  Cognition Arousal/Alertness: Awake/alert Behavior During Therapy: WFL for tasks assessed/performed Overall Cognitive Status: Within Functional Limits for tasks assessed                                        General Comments      Exercises     Assessment/Plan    PT Assessment Patient needs continued PT services  PT Problem List Decreased strength;Decreased range of motion;Decreased activity tolerance;Decreased balance;Decreased mobility;Decreased coordination;Decreased knowledge of use of DME;Decreased safety awareness;Decreased knowledge of precautions;Pain       PT Treatment Interventions DME instruction;Gait training;Stair training;Therapeutic activities;Therapeutic exercise;Balance training;Functional mobility training;Neuromuscular re-education;Patient/family education    PT Goals (Current goals can be found in the Care Plan section)  Acute Rehab PT Goals Patient Stated Goal: "home today" PT Goal Formulation: With patient Time For Goal Achievement: 12/07/18 Potential to Achieve Goals: Good    Frequency Min 3X/week   Barriers to discharge        Co-evaluation               AM-PAC PT "6 Clicks" Mobility  Outcome Measure Help needed turning from your back to your side while in a flat bed without using bedrails?: None Help needed moving from lying on your back to sitting on the side of a flat bed without using bedrails?: None Help needed moving to and from a bed to a chair (including a wheelchair)?: A Little Help needed standing up from a chair using your arms (e.g., wheelchair or bedside chair)?: A Little Help needed to  walk in hospital room?: A Little Help needed climbing 3-5 steps with a railing? : A Little 6 Click Score: 20    End of Session Equipment Utilized During Treatment: Gait belt Activity Tolerance: Patient tolerated treatment well Patient left: in chair;with call bell/phone within reach Nurse Communication: Mobility status PT Visit Diagnosis: Other abnormalities of gait and mobility (R26.89);Pain Pain - Right/Left: Right Pain - part of body: Leg    Time: 0842-0910 PT Time Calculation (min) (ACUTE ONLY): 28 min   Charges:   PT Evaluation $PT Eval Moderate Complexity: 1 Mod PT Treatments $Gait Training: 8-22 mins        Anastasio Champion, DPT  Acute Rehabilitation Services Pager [phone] Office [employer] 11/23/2018, 10:38 AM

## 2018-11-25 ENCOUNTER — Encounter (HOSPITAL_COMMUNITY): Payer: Self-pay | Admitting: Orthopedic Surgery

## 2019-01-11 ENCOUNTER — Other Ambulatory Visit: Payer: Self-pay | Admitting: Orthopedic Surgery

## 2019-01-13 NOTE — Telephone Encounter (Signed)
Please advise. Thanks.  

## 2020-03-01 IMAGING — RF DG TIBIA/FIBULA 2V*R*
1 series · 6 of 6 positions shown · non-contrast
Comparison: Same day radiographs

CLINICAL DATA: Right tibial intramedullary nail placement

EXAM:
RIGHT TIBIA AND FIBULA - 2 VIEW; DG C-ARM 1-60 MIN

[Series 1: run · 6 of 6 slices shown]
[im 1/6]
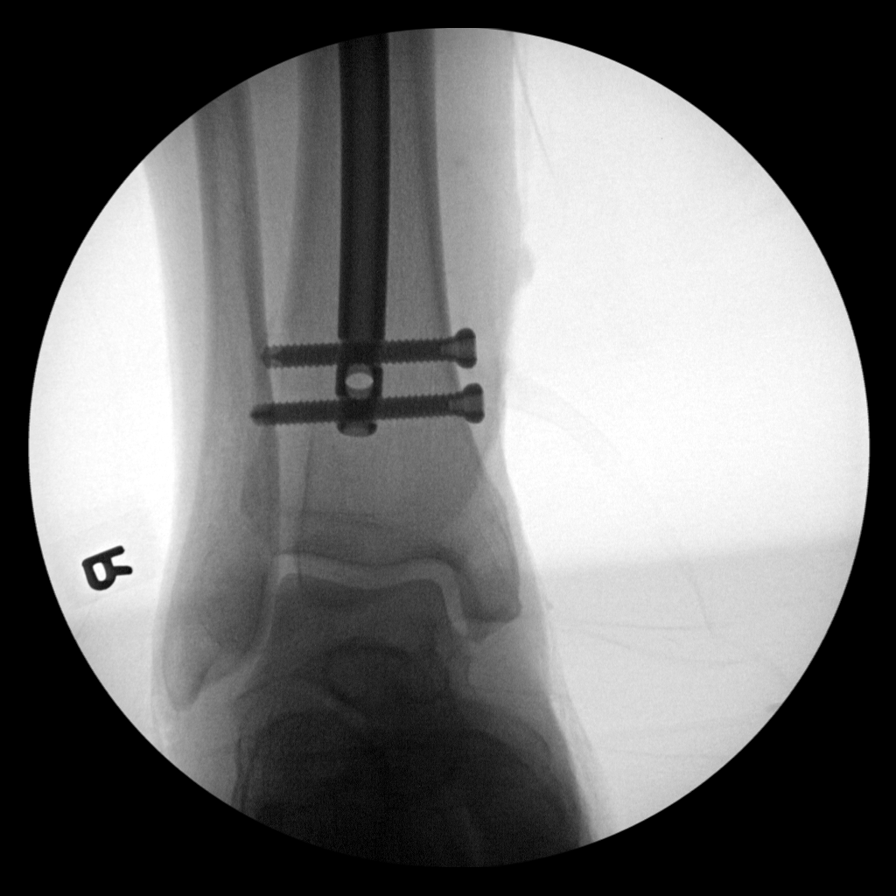
[im 2/6]
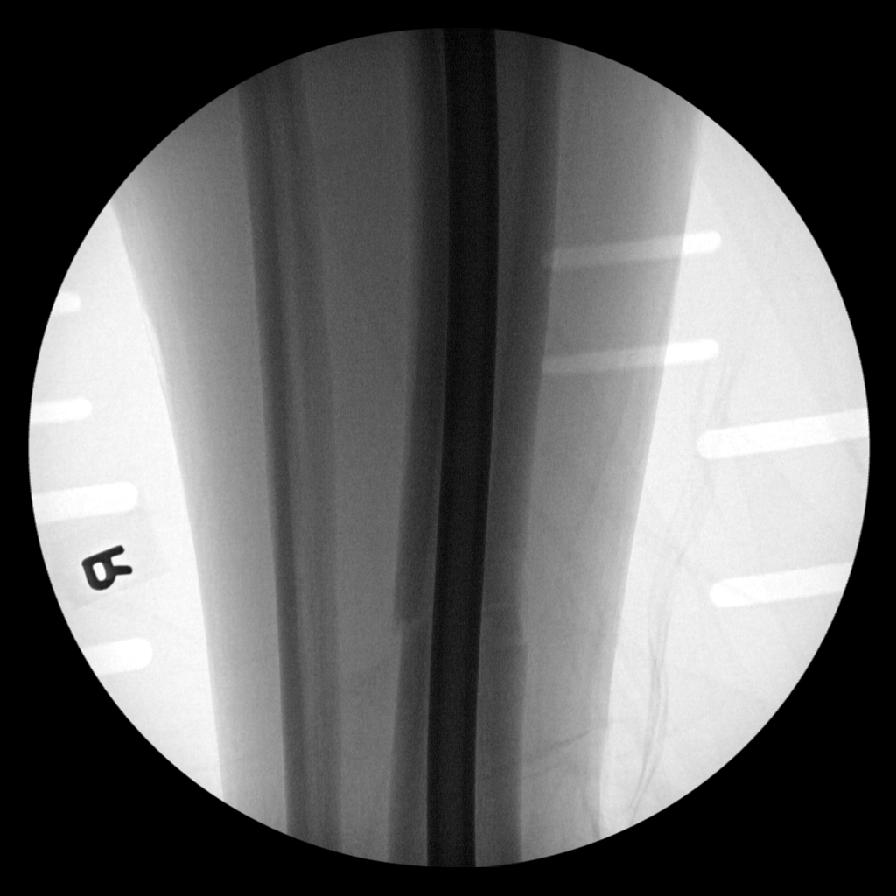
[im 3/6]
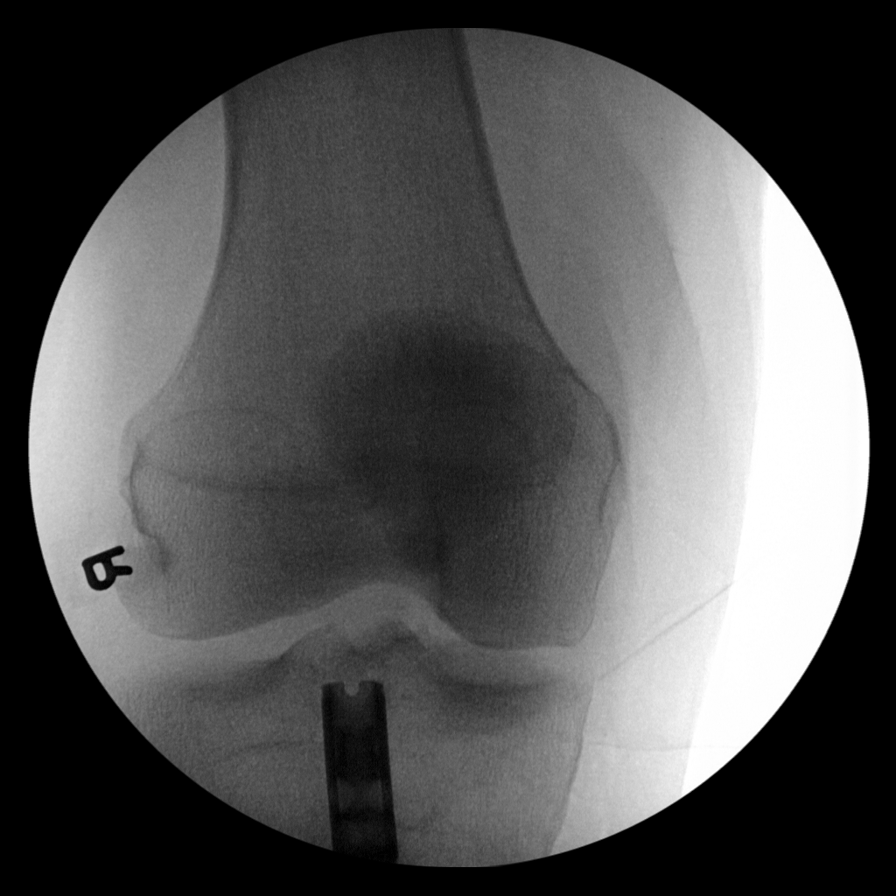
[im 4/6]
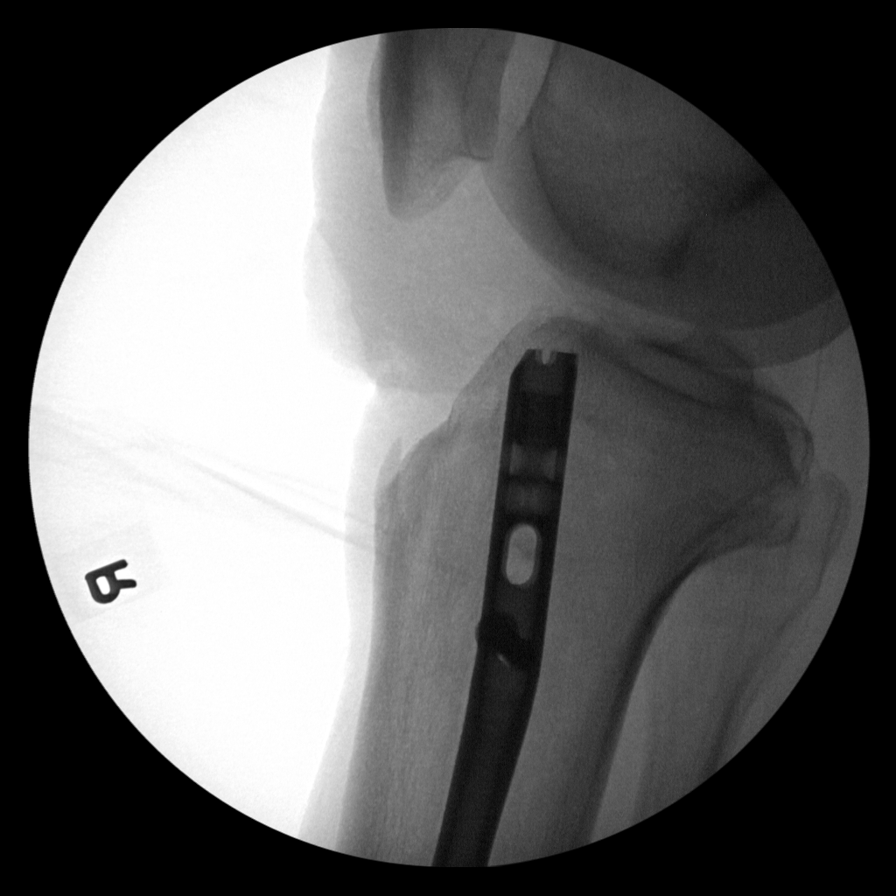
[im 5/6]
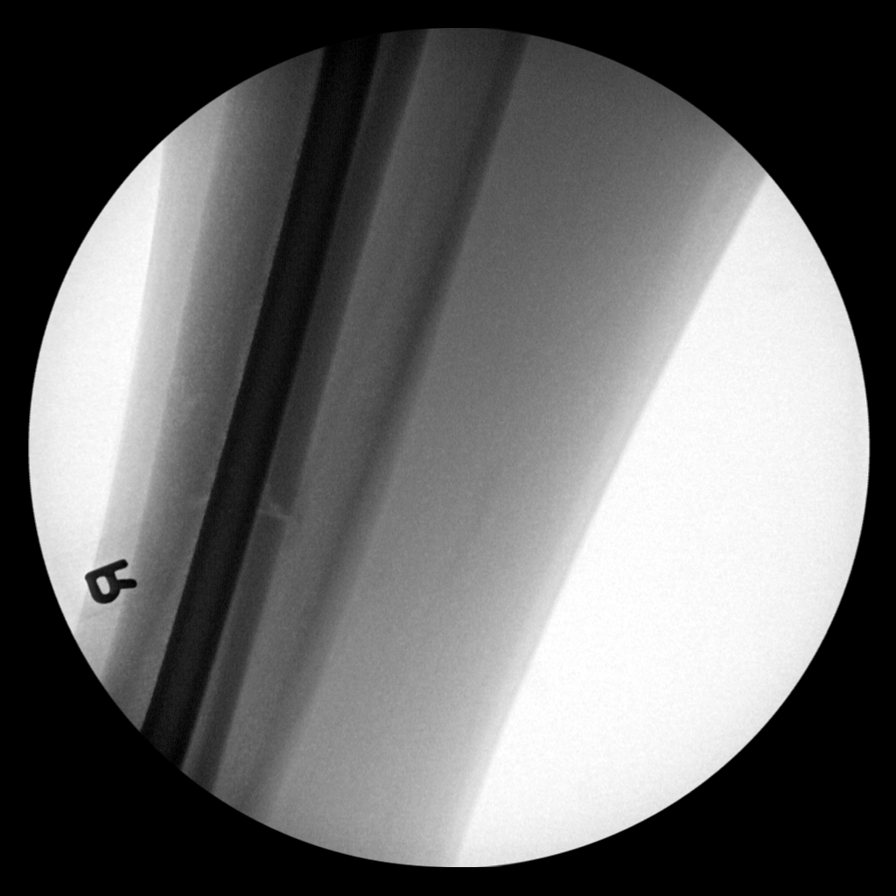
[im 6/6]
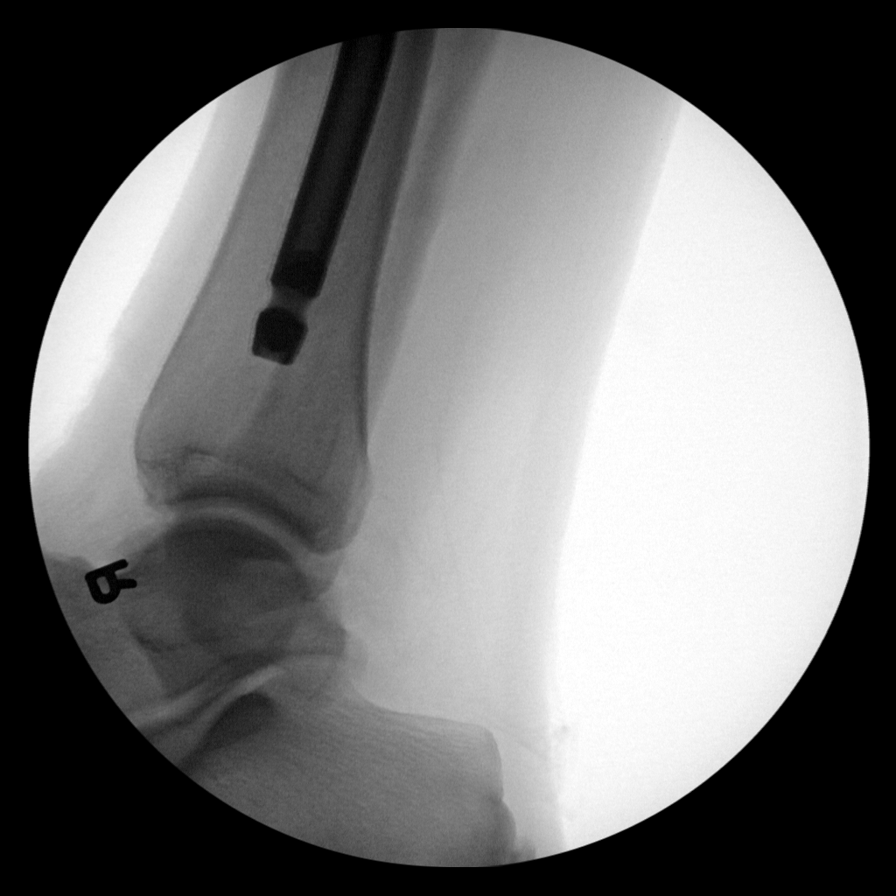

[6 of 6 positions shown; findings below may reference images not displayed]

FINDINGS: Patient is post open reduction, internal fixation of a right tibial
diaphyseal fracture with placement of a tibial intramedullary rod
secured proximally and distally threaded screws. Post fixation
alignment is near anatomic. Expected postsurgical soft tissue
changes are noted at the knee. No additional fractures are seen.
IMPRESSION: Interval open reduction and internal fixation of a right tibial
diaphyseal fracture with placement of a tibial intramedullary rod.
No acute complication.
